# Patient Record
Sex: Male | Born: 1955
Health system: Southern US, Community
[De-identification: ages and names within clinical notes are randomized; demographics above are authoritative.]

## PROBLEM LIST (undated history)

## (undated) DIAGNOSIS — D649 Anemia, unspecified: Secondary | ICD-10-CM

## (undated) DIAGNOSIS — F1010 Alcohol abuse, uncomplicated: Secondary | ICD-10-CM

## (undated) DIAGNOSIS — K922 Gastrointestinal hemorrhage, unspecified: Secondary | ICD-10-CM

## (undated) DIAGNOSIS — I10 Essential (primary) hypertension: Secondary | ICD-10-CM

## (undated) DIAGNOSIS — I82409 Acute embolism and thrombosis of unspecified deep veins of unspecified lower extremity: Secondary | ICD-10-CM

## (undated) HISTORY — PX: BACK SURGERY: SHX140

---

## 1999-03-25 ENCOUNTER — Encounter: Payer: Self-pay | Admitting: Emergency Medicine

## 1999-03-25 ENCOUNTER — Emergency Department (HOSPITAL_COMMUNITY): Admission: EM | Admit: 1999-03-25 | Discharge: 1999-03-25 | Payer: Self-pay | Admitting: Emergency Medicine

## 1999-10-29 ENCOUNTER — Emergency Department (HOSPITAL_COMMUNITY): Admission: EM | Admit: 1999-10-29 | Discharge: 1999-10-29 | Payer: Self-pay | Admitting: Emergency Medicine

## 1999-10-29 ENCOUNTER — Encounter: Payer: Self-pay | Admitting: Emergency Medicine

## 2002-04-06 ENCOUNTER — Emergency Department (HOSPITAL_COMMUNITY): Admission: EM | Admit: 2002-04-06 | Discharge: 2002-04-06 | Payer: Self-pay | Admitting: Emergency Medicine

## 2003-06-02 ENCOUNTER — Emergency Department (HOSPITAL_COMMUNITY): Admission: AD | Admit: 2003-06-02 | Discharge: 2003-06-02 | Payer: Self-pay | Admitting: *Deleted

## 2003-06-07 ENCOUNTER — Encounter: Payer: Self-pay | Admitting: Emergency Medicine

## 2003-06-07 ENCOUNTER — Emergency Department (HOSPITAL_COMMUNITY): Admission: EM | Admit: 2003-06-07 | Discharge: 2003-06-07 | Payer: Self-pay | Admitting: Emergency Medicine

## 2003-06-20 ENCOUNTER — Encounter: Admission: RE | Admit: 2003-06-20 | Discharge: 2003-06-20 | Payer: Self-pay | Admitting: Internal Medicine

## 2003-06-27 ENCOUNTER — Encounter: Admission: RE | Admit: 2003-06-27 | Discharge: 2003-06-27 | Payer: Self-pay | Admitting: Internal Medicine

## 2003-07-18 ENCOUNTER — Encounter: Admission: RE | Admit: 2003-07-18 | Discharge: 2003-07-18 | Payer: Self-pay | Admitting: Internal Medicine

## 2003-08-28 ENCOUNTER — Encounter: Admission: RE | Admit: 2003-08-28 | Discharge: 2003-08-28 | Payer: Self-pay | Admitting: Internal Medicine

## 2003-12-19 ENCOUNTER — Encounter: Admission: RE | Admit: 2003-12-19 | Discharge: 2003-12-19 | Payer: Self-pay | Admitting: Internal Medicine

## 2004-12-09 ENCOUNTER — Emergency Department (HOSPITAL_COMMUNITY): Admission: EM | Admit: 2004-12-09 | Discharge: 2004-12-09 | Payer: Self-pay | Admitting: Emergency Medicine

## 2004-12-23 ENCOUNTER — Ambulatory Visit: Payer: Self-pay | Admitting: Internal Medicine

## 2005-01-13 ENCOUNTER — Ambulatory Visit: Payer: Self-pay | Admitting: Internal Medicine

## 2005-04-21 ENCOUNTER — Ambulatory Visit: Payer: Self-pay | Admitting: Hospitalist

## 2005-05-29 ENCOUNTER — Ambulatory Visit: Payer: Self-pay | Admitting: Internal Medicine

## 2005-06-03 ENCOUNTER — Ambulatory Visit: Payer: Self-pay | Admitting: Internal Medicine

## 2005-07-17 ENCOUNTER — Ambulatory Visit: Payer: Self-pay | Admitting: Hospitalist

## 2005-07-23 ENCOUNTER — Ambulatory Visit: Payer: Self-pay | Admitting: Internal Medicine

## 2005-07-25 ENCOUNTER — Ambulatory Visit (HOSPITAL_COMMUNITY): Admission: RE | Admit: 2005-07-25 | Discharge: 2005-07-25 | Payer: Self-pay | Admitting: Hospitalist

## 2005-08-19 ENCOUNTER — Encounter: Payer: Self-pay | Admitting: Cardiology

## 2005-08-19 ENCOUNTER — Ambulatory Visit (HOSPITAL_COMMUNITY): Admission: RE | Admit: 2005-08-19 | Discharge: 2005-08-19 | Payer: Self-pay | Admitting: Internal Medicine

## 2005-08-19 ENCOUNTER — Ambulatory Visit: Payer: Self-pay | Admitting: Cardiology

## 2005-09-15 ENCOUNTER — Ambulatory Visit: Payer: Self-pay | Admitting: Internal Medicine

## 2005-12-21 ENCOUNTER — Ambulatory Visit: Payer: Self-pay | Admitting: Internal Medicine

## 2006-02-24 ENCOUNTER — Ambulatory Visit: Payer: Self-pay | Admitting: Hospitalist

## 2006-03-18 ENCOUNTER — Ambulatory Visit: Payer: Self-pay | Admitting: Hospitalist

## 2006-03-25 ENCOUNTER — Ambulatory Visit (HOSPITAL_BASED_OUTPATIENT_CLINIC_OR_DEPARTMENT_OTHER): Admission: RE | Admit: 2006-03-25 | Discharge: 2006-03-25 | Payer: Self-pay | Admitting: Hospitalist

## 2006-03-28 ENCOUNTER — Ambulatory Visit: Payer: Self-pay | Admitting: Internal Medicine

## 2006-07-31 DIAGNOSIS — Z8669 Personal history of other diseases of the nervous system and sense organs: Secondary | ICD-10-CM

## 2006-07-31 DIAGNOSIS — E785 Hyperlipidemia, unspecified: Secondary | ICD-10-CM

## 2006-07-31 DIAGNOSIS — F17200 Nicotine dependence, unspecified, uncomplicated: Secondary | ICD-10-CM

## 2006-07-31 DIAGNOSIS — K219 Gastro-esophageal reflux disease without esophagitis: Secondary | ICD-10-CM | POA: Insufficient documentation

## 2006-07-31 DIAGNOSIS — I10 Essential (primary) hypertension: Secondary | ICD-10-CM | POA: Insufficient documentation

## 2006-07-31 DIAGNOSIS — N259 Disorder resulting from impaired renal tubular function, unspecified: Secondary | ICD-10-CM | POA: Insufficient documentation

## 2006-07-31 DIAGNOSIS — J342 Deviated nasal septum: Secondary | ICD-10-CM

## 2006-11-01 DIAGNOSIS — R51 Headache: Secondary | ICD-10-CM | POA: Insufficient documentation

## 2006-11-01 DIAGNOSIS — R519 Headache, unspecified: Secondary | ICD-10-CM | POA: Insufficient documentation

## 2006-11-14 DIAGNOSIS — M549 Dorsalgia, unspecified: Secondary | ICD-10-CM | POA: Insufficient documentation

## 2006-11-28 ENCOUNTER — Emergency Department (HOSPITAL_COMMUNITY): Admission: EM | Admit: 2006-11-28 | Discharge: 2006-11-28 | Payer: Self-pay | Admitting: Emergency Medicine

## 2006-12-06 DIAGNOSIS — Z8719 Personal history of other diseases of the digestive system: Secondary | ICD-10-CM

## 2006-12-07 ENCOUNTER — Encounter: Payer: Self-pay | Admitting: *Deleted

## 2006-12-08 ENCOUNTER — Ambulatory Visit: Payer: Self-pay | Admitting: Hospitalist

## 2006-12-08 ENCOUNTER — Encounter (INDEPENDENT_AMBULATORY_CARE_PROVIDER_SITE_OTHER): Payer: Self-pay | Admitting: Unknown Physician Specialty

## 2006-12-08 LAB — CONVERTED CEMR LAB
Chloride: 106 meq/L (ref 96–112)
Glucose, Bld: 79 mg/dL (ref 70–99)
Potassium: 4.7 meq/L (ref 3.5–5.3)
Sodium: 139 meq/L (ref 135–145)

## 2006-12-11 ENCOUNTER — Encounter (INDEPENDENT_AMBULATORY_CARE_PROVIDER_SITE_OTHER): Payer: Self-pay | Admitting: Unknown Physician Specialty

## 2006-12-11 ENCOUNTER — Ambulatory Visit (HOSPITAL_COMMUNITY): Admission: RE | Admit: 2006-12-11 | Discharge: 2006-12-11 | Payer: Self-pay | Admitting: Unknown Physician Specialty

## 2006-12-12 LAB — CONVERTED CEMR LAB: OCCULT 1: NEGATIVE

## 2006-12-13 LAB — CONVERTED CEMR LAB: OCCULT 3: NEGATIVE

## 2006-12-17 ENCOUNTER — Ambulatory Visit: Payer: Self-pay | Admitting: Internal Medicine

## 2006-12-29 ENCOUNTER — Ambulatory Visit: Payer: Self-pay | Admitting: Internal Medicine

## 2006-12-30 ENCOUNTER — Encounter (INDEPENDENT_AMBULATORY_CARE_PROVIDER_SITE_OTHER): Payer: Self-pay | Admitting: Internal Medicine

## 2006-12-30 DIAGNOSIS — IMO0002 Reserved for concepts with insufficient information to code with codable children: Secondary | ICD-10-CM | POA: Insufficient documentation

## 2006-12-30 LAB — CONVERTED CEMR LAB
Chloride: 107 meq/L (ref 96–112)
Potassium: 4.2 meq/L (ref 3.5–5.3)
Sodium: 142 meq/L (ref 135–145)

## 2007-03-31 ENCOUNTER — Telehealth (INDEPENDENT_AMBULATORY_CARE_PROVIDER_SITE_OTHER): Payer: Self-pay | Admitting: Unknown Physician Specialty

## 2007-04-08 ENCOUNTER — Encounter (INDEPENDENT_AMBULATORY_CARE_PROVIDER_SITE_OTHER): Payer: Self-pay | Admitting: Hospitalist

## 2007-04-08 ENCOUNTER — Telehealth: Payer: Self-pay | Admitting: *Deleted

## 2007-04-25 ENCOUNTER — Ambulatory Visit: Payer: Self-pay | Admitting: Hospitalist

## 2007-05-03 ENCOUNTER — Ambulatory Visit: Payer: Self-pay | Admitting: Internal Medicine

## 2007-05-03 ENCOUNTER — Encounter (INDEPENDENT_AMBULATORY_CARE_PROVIDER_SITE_OTHER): Payer: Self-pay | Admitting: Hospitalist

## 2007-05-03 LAB — CONVERTED CEMR LAB
Albumin: 4.4 g/dL (ref 3.5–5.2)
BUN: 12 mg/dL (ref 6–23)
CO2: 22 meq/L (ref 19–32)
Calcium: 9.3 mg/dL (ref 8.4–10.5)
Cholesterol: 190 mg/dL (ref 0–200)
Glucose, Bld: 87 mg/dL (ref 70–99)
HDL: 31 mg/dL — ABNORMAL LOW (ref >39)
Potassium: 4.4 meq/L (ref 3.5–5.3)
Sodium: 144 meq/L (ref 135–145)
Total Protein: 6.6 g/dL (ref 6.0–8.3)
Triglycerides: 156 mg/dL — ABNORMAL HIGH (ref <150)

## 2007-08-09 ENCOUNTER — Ambulatory Visit: Payer: Self-pay | Admitting: Internal Medicine

## 2007-08-09 DIAGNOSIS — F411 Generalized anxiety disorder: Secondary | ICD-10-CM | POA: Insufficient documentation

## 2009-04-02 ENCOUNTER — Emergency Department (HOSPITAL_BASED_OUTPATIENT_CLINIC_OR_DEPARTMENT_OTHER): Admission: EM | Admit: 2009-04-02 | Discharge: 2009-04-02 | Payer: Self-pay | Admitting: Emergency Medicine

## 2009-10-31 ENCOUNTER — Encounter (INDEPENDENT_AMBULATORY_CARE_PROVIDER_SITE_OTHER): Payer: Self-pay | Admitting: *Deleted

## 2009-11-17 ENCOUNTER — Emergency Department (HOSPITAL_COMMUNITY): Admission: EM | Admit: 2009-11-17 | Discharge: 2009-11-17 | Payer: Self-pay | Admitting: Emergency Medicine

## 2010-06-28 ENCOUNTER — Emergency Department (HOSPITAL_BASED_OUTPATIENT_CLINIC_OR_DEPARTMENT_OTHER): Admission: EM | Admit: 2010-06-28 | Discharge: 2010-06-28 | Payer: Self-pay | Admitting: Emergency Medicine

## 2010-07-09 ENCOUNTER — Emergency Department (HOSPITAL_BASED_OUTPATIENT_CLINIC_OR_DEPARTMENT_OTHER): Admission: EM | Admit: 2010-07-09 | Discharge: 2010-07-09 | Payer: Self-pay | Admitting: Emergency Medicine

## 2010-07-09 ENCOUNTER — Ambulatory Visit: Payer: Self-pay | Admitting: Diagnostic Radiology

## 2010-08-17 ENCOUNTER — Emergency Department (HOSPITAL_BASED_OUTPATIENT_CLINIC_OR_DEPARTMENT_OTHER): Admission: EM | Admit: 2010-08-17 | Discharge: 2010-08-17 | Payer: Self-pay | Admitting: Emergency Medicine

## 2010-08-27 ENCOUNTER — Encounter
Admission: RE | Admit: 2010-08-27 | Discharge: 2010-11-06 | Payer: Self-pay | Source: Home / Self Care | Attending: Orthopedic Surgery | Admitting: Orthopedic Surgery

## 2010-11-11 NOTE — Letter (Signed)
Summary: Generic Letter  [facility]  [address] [place], [place] [address]   Phone: [phone]  Fax: [phone]    10/31/09  CHRISTIPHER RIEGER [address] [place], [place]  [address]  Dear Mr. MENO,   We have been unable to reach you by phone. Just wanted you to be aware we now have a "no show policy." That means if you miss 4 or more appointments you risk the opportunity to receive services here. Please make sure you call and cancel appointments if you are not able to come. Cancelled appointments will not be held against you.        Sincerely,   Gaylyn Cheers RN

## 2011-01-02 LAB — URINALYSIS, ROUTINE W REFLEX MICROSCOPIC
Bilirubin Urine: NEGATIVE
Glucose, UA: NEGATIVE mg/dL
Ketones, ur: 15 mg/dL — AB
Nitrite: NEGATIVE
pH: 6.5 (ref 5.0–8.0)

## 2011-01-02 LAB — CBC
Hemoglobin: 15.1 g/dL (ref 13.0–17.0)
RBC: 4.83 MIL/uL (ref 4.22–5.81)
RDW: 14.4 % (ref 11.5–15.5)
WBC: 7.1 10*3/uL (ref 4.0–10.5)

## 2011-01-02 LAB — POCT CARDIAC MARKERS
Troponin i, poc: <0.05 ng/mL (ref 0.00–0.09)
Troponin i, poc: <0.05 ng/mL (ref 0.00–0.09)

## 2011-01-02 LAB — DIFFERENTIAL
Basophils Absolute: 0.1 10*3/uL (ref 0.0–0.1)
Lymphocytes Relative: 36 % (ref 12–46)
Lymphs Abs: 2.5 10*3/uL (ref 0.7–4.0)
Monocytes Absolute: 0.8 10*3/uL (ref 0.1–1.0)
Monocytes Relative: 11 % (ref 3–12)
Neutro Abs: 3.7 10*3/uL (ref 1.7–7.7)

## 2011-01-02 LAB — POCT I-STAT, CHEM 8
BUN: 23 mg/dL (ref 6–23)
Calcium, Ion: 0.86 mmol/L — ABNORMAL LOW (ref 1.12–1.32)
Chloride: 110 mEq/L (ref 96–112)
Creatinine, Ser: 1.7 mg/dL — ABNORMAL HIGH (ref 0.4–1.5)

## 2011-01-02 LAB — GLUCOSE, CAPILLARY

## 2011-01-02 LAB — ETHANOL: Alcohol, Ethyl (B): 31 mg/dL — ABNORMAL HIGH (ref 0–10)

## 2011-02-27 NOTE — Procedures (Signed)
NAME:  Timothy Chambers, Timothy Chambers NO.:  [id number]   MEDICAL RECORD NO.:  [id number]          PATIENT TYPE:  OUT   LOCATION:  SLEEP CENTER                 FACILITY:  [facility]   PHYSICIAN:  Clinton D. Maple Hudson, M.D. DATE OF BIRTH:  [birth year]   DATE OF STUDY:  03/25/2006                              NOCTURNAL POLYSOMNOGRAM   REFERRING PHYSICIAN:  Dr. Eliseo Gum.   INDICATIONS FOR STUDY:  Hypersomnia with sleep apnea.   EPWORTH SLEEPINESS SCORE:  05/24.   BMI:  23.   WEIGHT:  142 pounds.   HOME MEDICATIONS:  Toprol, HCTZ.   SLEEP ARCHITECTURE:  Of total sleep time 345 minutes with sleep efficiency  75%.  Stage I was 4%, stage II 78%, stages III and IV absent, REM 18% of  total sleep time.  Sleep latency 11 minutes, REM latency 88 minutes, awake  after sleep onset 106 minutes, arousal index increased at 33 indicating  increased fragmentation.  No bedtime medication was taken.   RESPIRATORY DATA:  Split study protocol.  Apnea/hypopnea index (AHI, RDI)  12.8 obstructive events per hour indicating mild obstructive sleep  apnea/hypopnea syndrome before CPAP.  This included 13 obstructive apneas  and 17 hypopneas before CPAP.  Events were positional, more frequent but not  limited to the supine position.  REM AHI 8.8 per hour.  CPAP was titrated to  10 CWP, AHI 2.2 per hour.  A petite Respironics ComfortGel nasal mask was  used with heated humidity.   OXYGEN DATA:  Very loud snoring was noted with oxygen desaturation to a  nadir of 90% before CPAP.  After CPAP control snoring was prevented and  oxygen saturation held 96-97% on room air.   CARDIAC DATA:  Normal sinus rhythm.   MOVEMENT/PARASOMNIA:  A total of 76 limb jerks were recorded of which 62  were associated with arousal or awakening for a significant periodic limb  movement with arousal index of 10.8 per hour.  Bathroom times one.   IMPRESSION/RECOMMENDATIONS:  1.  Mild obstructive sleep apnea/hypopnea syndrome,  AHI 12.8 per hour with      positional events more common while supine, very loud snoring and oxygen      desaturation to a nadir of 90%.  2.  Successful CPAP titration to 10 CWP, AHI 2.2 per hour.  A petite      Respironics ComfortGel nasal mask was used with heated humidifier.  3.  Periodic limb movement with arousal, 10.8 per hour.  This may partly      reflect sleep disturbance of CPAP titration but if persistent after      adjustment to home CPAP then he may benefit from a trial of specific      therapy such as Requip or MiraPex if appropriate.      Clinton D. Maple Hudson, M.D.  Diplomate, Biomedical engineer of Sleep Medicine  Electronically Signed     CDY/MEDQ  D:  03/28/2006 10:19:48  T:  03/29/2006 11:03:31  Job:  [id number]

## 2012-02-24 ENCOUNTER — Emergency Department (HOSPITAL_BASED_OUTPATIENT_CLINIC_OR_DEPARTMENT_OTHER)
Admission: EM | Admit: 2012-02-24 | Discharge: 2012-02-24 | Disposition: A | Discharge status: Home / Self Care | Payer: Managed Care, Other (non HMO) | Attending: Emergency Medicine | Admitting: Emergency Medicine

## 2012-02-24 ENCOUNTER — Encounter (HOSPITAL_BASED_OUTPATIENT_CLINIC_OR_DEPARTMENT_OTHER): Payer: Self-pay | Admitting: *Deleted

## 2012-02-24 DIAGNOSIS — R21 Rash and other nonspecific skin eruption: Secondary | ICD-10-CM

## 2012-02-24 DIAGNOSIS — I10 Essential (primary) hypertension: Secondary | ICD-10-CM | POA: Insufficient documentation

## 2012-02-24 HISTORY — DX: Essential (primary) hypertension: I10

## 2012-02-24 MED ORDER — PREDNISONE (PAK) 10 MG PO TABS: 10.0000 mg | ORAL_TABLET | Freq: Every day | ORAL | Status: AC

## 2012-02-24 NOTE — ED Notes (Signed)
PA at bedside.

## 2012-02-24 NOTE — Discharge Instructions (Signed)
Rash A rash is a change in the color or texture of your skin. There are many different types of rashes. You may have other problems that accompany your rash. CAUSES   Infections.   Allergic reactions. This can include allergies to pets or foods.   Certain medicines.   Exposure to certain chemicals, soaps, or cosmetics.   Heat.   Exposure to poisonous plants.   Tumors, both cancerous and noncancerous.  SYMPTOMS   Redness.   Scaly skin.   Itchy skin.   Dry or cracked skin.   Bumps.   Blisters.   Pain.  DIAGNOSIS  Your caregiver may do a physical exam to determine what type of rash you have. A skin sample (biopsy) may be taken and examined under a microscope. TREATMENT  Treatment depends on the type of rash you have. Your caregiver may prescribe certain medicines. For serious conditions, you may need to see a skin doctor (dermatologist). HOME CARE INSTRUCTIONS   Avoid the substance that caused your rash.   Do not scratch your rash. This can cause infection.   You may take cool baths to help stop itching.   Only take over-the-counter or prescription medicines as directed by your caregiver.   Keep all follow-up appointments as directed by your caregiver.  SEEK IMMEDIATE MEDICAL CARE IF:  You have increasing pain, swelling, or redness.   You have a fever.   You have new or severe symptoms.   You have body aches, diarrhea, or vomiting.   Your rash is not better after 3 days.  MAKE SURE YOU:  Understand these instructions.   Will watch your condition.   Will get help right away if you are not doing well or get worse.  Document Released: 09/18/2002 Document Revised: 09/17/2011 Document Reviewed: 07/13/2011 ExitCare Patient Information 2012 [employer]. 

## 2012-02-24 NOTE — ED Notes (Signed)
Pt c/o rash to neck x 2 weeks

## 2012-02-24 NOTE — ED Provider Notes (Signed)
History     CSN: [id number]  Arrival date & time 02/24/12  1749   First MD Initiated Contact with Patient 02/24/12 1756      Chief Complaint  Patient presents with  . Rash    (Consider location/radiation/quality/duration/timing/severity/associated sxs/prior treatment) HPI Comments: Pt states that 2 weeks ago after being seen at the barber shop he developed a rash on the back of his neck:pt states that then while mowing the lawn today he broke out on a rash on his arms bilaterally  Patient is a 57 y.o. male presenting with rash. The history is provided by the patient. No language interpreter was used.  Rash  This is a new problem. The current episode started more than 1 week ago. The problem has not changed since onset.The problem is associated with an unknown factor. There has been no fever. The rash is present on the neck (bilateral arms). The patient is experiencing no pain. The pain has been constant since onset. Associated symptoms include itching. Pertinent negatives include no pain and no weeping. Treatments tried: antifungal otc. The treatment provided no relief.    Past Medical History  Diagnosis Date  . Hypertension     History reviewed. No pertinent past surgical history.  History reviewed. No pertinent family history.  History  Substance Use Topics  . Smoking status: Current Everyday Smoker -- 0.5 packs/day  . Smokeless tobacco: Not on file  . Alcohol Use: No      Review of Systems  Constitutional: Negative.   Respiratory: Negative.   Cardiovascular: Negative.   Skin: Positive for itching and rash.    Allergies  Hydrocodone-acetaminophen  Home Medications  No current outpatient prescriptions on file.  BP 161/97  Pulse 89  Temp 98.5 F (36.9 C)  Resp 16  Ht 5\' 5"  (1.651 m)  Wt 160 lb (72.576 kg)  BMI 26.63 kg/m2  SpO2 99%  Physical Exam  Nursing note and vitals reviewed. Constitutional: He appears well-developed and well-nourished.  Neck:  Normal range of motion. Neck supple.  Cardiovascular: Normal rate and regular rhythm.   Pulmonary/Chest: Effort normal and breath sounds normal.  Musculoskeletal: Normal range of motion.  Neurological: He is alert.  Skin:       Pt has red scaly patches to back of the neck:pt has red raised papules to bilateral arms and hands    ED Course  Procedures (including critical care time)  Labs Reviewed - No data to display No results found.   1. Rash       MDM  Both are allergic in a nature will treat with steroids         Teressa Lower, NP 02/24/12 1819

## 2012-02-24 NOTE — ED Provider Notes (Signed)
Medical screening examination/treatment/procedure(s) were performed by non-physician practitioner and as supervising physician I was immediately available for consultation/collaboration.   Lirio Bach B. Bernette Mayers, MD 02/24/12 4098

## 2012-05-17 ENCOUNTER — Emergency Department (HOSPITAL_BASED_OUTPATIENT_CLINIC_OR_DEPARTMENT_OTHER)
Admission: EM | Admit: 2012-05-17 | Discharge: 2012-05-17 | Discharge status: Against Medical Advice | Payer: Managed Care, Other (non HMO)

## 2013-03-10 ENCOUNTER — Emergency Department (HOSPITAL_BASED_OUTPATIENT_CLINIC_OR_DEPARTMENT_OTHER): Payer: Managed Care, Other (non HMO)

## 2013-03-10 ENCOUNTER — Emergency Department (HOSPITAL_BASED_OUTPATIENT_CLINIC_OR_DEPARTMENT_OTHER)
Admission: EM | Admit: 2013-03-10 | Discharge: 2013-03-10 | Disposition: A | Discharge status: Home / Self Care | Payer: Managed Care, Other (non HMO) | Attending: Emergency Medicine | Admitting: Emergency Medicine

## 2013-03-10 ENCOUNTER — Encounter (HOSPITAL_BASED_OUTPATIENT_CLINIC_OR_DEPARTMENT_OTHER): Payer: Self-pay | Admitting: *Deleted

## 2013-03-10 DIAGNOSIS — S79919A Unspecified injury of unspecified hip, initial encounter: Secondary | ICD-10-CM | POA: Insufficient documentation

## 2013-03-10 DIAGNOSIS — IMO0002 Reserved for concepts with insufficient information to code with codable children: Secondary | ICD-10-CM | POA: Insufficient documentation

## 2013-03-10 DIAGNOSIS — Y92410 Unspecified street and highway as the place of occurrence of the external cause: Secondary | ICD-10-CM | POA: Insufficient documentation

## 2013-03-10 DIAGNOSIS — I10 Essential (primary) hypertension: Secondary | ICD-10-CM | POA: Insufficient documentation

## 2013-03-10 DIAGNOSIS — S79929A Unspecified injury of unspecified thigh, initial encounter: Secondary | ICD-10-CM | POA: Insufficient documentation

## 2013-03-10 DIAGNOSIS — Z79899 Other long term (current) drug therapy: Secondary | ICD-10-CM | POA: Insufficient documentation

## 2013-03-10 DIAGNOSIS — T07XXXA Unspecified multiple injuries, initial encounter: Secondary | ICD-10-CM | POA: Insufficient documentation

## 2013-03-10 DIAGNOSIS — Y9389 Activity, other specified: Secondary | ICD-10-CM | POA: Insufficient documentation

## 2013-03-10 DIAGNOSIS — F17200 Nicotine dependence, unspecified, uncomplicated: Secondary | ICD-10-CM | POA: Insufficient documentation

## 2013-03-10 DIAGNOSIS — S8990XA Unspecified injury of unspecified lower leg, initial encounter: Secondary | ICD-10-CM | POA: Insufficient documentation

## 2013-03-10 LAB — URINALYSIS, ROUTINE W REFLEX MICROSCOPIC
Bilirubin Urine: NEGATIVE
Hgb urine dipstick: NEGATIVE
Specific Gravity, Urine: 1.018 (ref 1.005–1.030)
pH: 6.5 (ref 5.0–8.0)

## 2013-03-10 MED ORDER — OXYCODONE HCL 5 MG PO TABS
5.0000 mg | ORAL_TABLET | Freq: Once | ORAL | Status: DC
  Filled 2013-03-10: qty 1

## 2013-03-10 MED ORDER — IBUPROFEN 800 MG PO TABS
800.0000 mg | ORAL_TABLET | Freq: Once | ORAL | Status: AC
  Administered 2013-03-10: 800 mg via ORAL
  Filled 2013-03-10: qty 1

## 2013-03-10 MED ORDER — OXYCODONE-ACETAMINOPHEN 5-325 MG PO TABS: 2.0000 | ORAL_TABLET | ORAL | Status: DC | PRN

## 2013-03-10 MED ORDER — MORPHINE SULFATE 4 MG/ML IJ SOLN
4.0000 mg | Freq: Once | INTRAMUSCULAR | Status: AC
  Administered 2013-03-10: 4 mg via INTRAMUSCULAR
  Filled 2013-03-10: qty 1

## 2013-03-10 MED ORDER — IBUPROFEN 800 MG PO TABS: 800.0000 mg | ORAL_TABLET | Freq: Three times a day (TID) | ORAL | Status: DC
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
## 2013-09-28 ENCOUNTER — Emergency Department (HOSPITAL_BASED_OUTPATIENT_CLINIC_OR_DEPARTMENT_OTHER)
Admission: EM | Admit: 2013-09-28 | Discharge: 2013-09-28 | Disposition: A | Discharge status: Home / Self Care | Payer: Managed Care, Other (non HMO) | Attending: Emergency Medicine | Admitting: Emergency Medicine

## 2013-09-28 ENCOUNTER — Encounter (HOSPITAL_BASED_OUTPATIENT_CLINIC_OR_DEPARTMENT_OTHER): Payer: Self-pay | Admitting: Emergency Medicine

## 2013-09-28 DIAGNOSIS — I10 Essential (primary) hypertension: Secondary | ICD-10-CM | POA: Insufficient documentation

## 2013-09-28 DIAGNOSIS — F1010 Alcohol abuse, uncomplicated: Secondary | ICD-10-CM

## 2013-09-28 DIAGNOSIS — Z79899 Other long term (current) drug therapy: Secondary | ICD-10-CM | POA: Insufficient documentation

## 2013-09-28 DIAGNOSIS — F17200 Nicotine dependence, unspecified, uncomplicated: Secondary | ICD-10-CM | POA: Insufficient documentation

## 2013-09-28 HISTORY — DX: Alcohol abuse, uncomplicated: F10.10

## 2013-09-28 LAB — CBC WITH DIFFERENTIAL/PLATELET
Basophils Absolute: 0 10*3/uL (ref 0.0–0.1)
Basophils Relative: 0 % (ref 0–1)
Eosinophils Relative: 3 % (ref 0–5)
HCT: 39.8 % (ref 39.0–52.0)
Lymphocytes Relative: 32 % (ref 12–46)
MCHC: 34.4 g/dL (ref 30.0–36.0)
Monocytes Absolute: 1 10*3/uL (ref 0.1–1.0)
Neutro Abs: 4.1 10*3/uL (ref 1.7–7.7)
Platelets: 245 10*3/uL (ref 150–400)
RBC: 4.39 MIL/uL (ref 4.22–5.81)
RDW: 13.6 % (ref 11.5–15.5)

## 2013-09-28 LAB — COMPREHENSIVE METABOLIC PANEL
AST: 68 U/L — ABNORMAL HIGH (ref 0–37)
Albumin: 4 g/dL (ref 3.5–5.2)
CO2: 24 mEq/L (ref 19–32)
Calcium: 9.4 mg/dL (ref 8.4–10.5)
Creatinine, Ser: 1.5 mg/dL — ABNORMAL HIGH (ref 0.50–1.35)
GFR calc Af Amer: 59 mL/min — ABNORMAL LOW (ref >90)
Potassium: 3.7 mEq/L (ref 3.5–5.1)
Sodium: 138 mEq/L (ref 135–145)
Total Protein: 7.4 g/dL (ref 6.0–8.3)

## 2013-09-28 LAB — RAPID URINE DRUG SCREEN, HOSP PERFORMED
Amphetamines: NOT DETECTED
Cocaine: NOT DETECTED
Opiates: NOT DETECTED
Tetrahydrocannabinol: NOT DETECTED

## 2013-09-28 NOTE — ED Notes (Signed)
Pt amb to triage with quick steady gait in nad. Pt states that he drank an "internal cleanser and detox" and needs [place] to write him a note so that he does not have to go to detox and can join [facility]. Pt denies any c/o, states "I feel great, I just need a note.Marland KitchenMarland Kitchen"

## 2013-09-28 NOTE — ED Provider Notes (Signed)
CSN: [id number]     Arrival date & time 09/28/13  1659 History   First MD Initiated Contact with Patient 09/28/13 1726     Chief Complaint  Patient presents with  . Medical Clearance   (Consider location/radiation/quality/duration/timing/severity/associated sxs/prior Treatment) HPI Comments: Patient presents requesting medical clearance. He admits to relapsing with drinking alcohol last week. Use an over-the-counter "detox" solution from [facility] and has been taking a lot of water. He states he wants a note so he can go to an alcoholic anonymous type meeting. He denies any suicidal ideation or homicidal ideation. His last drink was 8 days ago. He denies any shakes, numbness or tingling. Denies any pain. He denies any intentional ingestions.  The history is provided by the patient.    Past Medical History  Diagnosis Date  . Hypertension   . Alcohol abuse    Past Surgical History  Procedure Laterality Date  . Back surgery     History reviewed. No pertinent family history. History  Substance Use Topics  . Smoking status: Current Every Day Smoker -- 0.50 packs/day  . Smokeless tobacco: Not on file  . Alcohol Use: Yes     Comment: occ    Review of Systems  Constitutional: Negative for fever, activity change and appetite change.  Eyes: Negative for visual disturbance.  Respiratory: Negative for cough, chest tightness and shortness of breath.   Cardiovascular: Negative for chest pain.  Gastrointestinal: Negative for nausea, vomiting and abdominal pain.  Genitourinary: Negative for dysuria and hematuria.  Musculoskeletal: Negative for arthralgias, back pain and myalgias.  Skin: Negative for wound.  Neurological: Negative for dizziness, weakness and headaches.  A complete 10 system review of systems was obtained and all systems are negative except as noted in the HPI and PMH.    Allergies  Hydrocodone-acetaminophen  Home Medications   Current Outpatient Rx  Name  Route  Sig   Dispense  Refill  . amLODipine-valsartan (EXFORGE) 5-160 MG per tablet   Oral   Take 1 tablet by mouth daily.         Marland Kitchen ibuprofen (ADVIL,MOTRIN) 800 MG tablet   Oral   Take 1 tablet (800 mg total) by mouth 3 (three) times daily.   21 tablet   0   . oxyCODONE-acetaminophen (PERCOCET/ROXICET) 5-325 MG per tablet   Oral   Take 2 tablets by mouth every 4 (four) hours as needed for pain.   15 tablet   0    BP 173/99  Pulse 98  Temp(Src) 98.5 F (36.9 C) (Oral)  Resp 18  SpO2 100% Physical Exam  Constitutional: He is oriented to person, place, and time. He appears well-developed and well-nourished. No distress.  HENT:  Head: Normocephalic and atraumatic.  Mouth/Throat: Oropharynx is clear and moist. No oropharyngeal exudate.  Eyes: Conjunctivae and EOM are normal. Pupils are equal, round, and reactive to light.  Neck: Normal range of motion. Neck supple.  Cardiovascular: Normal rate, regular rhythm and normal heart sounds.   No murmur heard. Pulmonary/Chest: Effort normal and breath sounds normal.  Abdominal: Soft. There is no tenderness. There is no rebound and no guarding.  Musculoskeletal: Normal range of motion. He exhibits no edema and no tenderness.  Neurological: He is alert and oriented to person, place, and time. No cranial nerve deficit. He exhibits normal muscle tone. Coordination normal.  No tremors, no asterixis CN 2-12 intact, no ataxia on finger to nose, no nystagmus, 5/5 strength throughout, no pronator drift, Romberg negative, normal gait.  Skin: Skin is warm.    ED Course  Procedures (including critical care time) Labs Review Labs Reviewed  COMPREHENSIVE METABOLIC PANEL - Abnormal; Notable for the following:    Creatinine, Ser 1.50 (*)    AST 68 (*)    ALT 93 (*)    GFR calc non Af Amer 51 (*)    GFR calc Af Amer 59 (*)    All other components within normal limits  CBC WITH DIFFERENTIAL  ETHANOL  URINE RAPID DRUG SCREEN (HOSP PERFORMED)    Imaging Review No results found.  EKG Interpretation   None       MDM   1. Alcohol abuse    History of alcohol abuse requesting medical clearance before joining a support group. Denies SI or HI.  Vital stable, no distress. No evidence of active alcohol withdrawal.  Labs reviewed. Mild elevation of AST and ALT likely secondary to alcohol abuse. No right upper quadrant pain or vomiting. Creatinine is at baseline. Patient is medically clear for participation in his rehabilitation program.  BP 173/99  Pulse 98  Temp(Src) 98.5 F (36.9 C) (Oral)  Resp 18  SpO2 100%   Glynn Octave, MD 09/28/13 2132

## 2017-12-23 ENCOUNTER — Ambulatory Visit (INDEPENDENT_AMBULATORY_CARE_PROVIDER_SITE_OTHER): Payer: Self-pay | Admitting: Family Medicine

## 2017-12-23 ENCOUNTER — Encounter: Payer: Self-pay | Admitting: Family Medicine

## 2017-12-23 VITALS — BP 170/120 | HR 97 | Temp 98.9°F | Ht 65.0 in | Wt 144.4 lb

## 2017-12-23 DIAGNOSIS — I10 Essential (primary) hypertension: Secondary | ICD-10-CM

## 2017-12-23 MED ORDER — AMLODIPINE BESYLATE 5 MG PO TABS: 5.0000 mg | ORAL_TABLET | Freq: Every day | ORAL | 3 refills | Status: DC

## 2017-12-23 MED ORDER — HYDROCHLOROTHIAZIDE 25 MG PO TABS: 25.0000 mg | ORAL_TABLET | Freq: Every day | ORAL | 3 refills | Status: DC

## 2017-12-23 NOTE — Progress Notes (Signed)
Pre visit review using our clinic review tool, if applicable. No additional management support is needed unless otherwise documented below in the visit note. 

## 2017-12-23 NOTE — Progress Notes (Signed)
Chief Complaint  Patient presents with  . Establish Care       New Patient Visit SUBJECTIVE: HPI: Timothy Chambers is an 62 y.o.male who is being seen for establishing care.  Hypertension Patient presents for hypertension discussion. He does not monitor home blood pressures. He is compliant with medications. Patient has these side effects of medication: none He is adhering to a healthy diet overall. Exercise: physically active at home, walks Has been on Exforge in past, but got expensive. +famhx of heart disease and HTN. Denies CP or SOB.   Allergies  Allergen Reactions  . Hydrocodone-Acetaminophen     itch    Past Medical History:  Diagnosis Date  . Alcohol abuse   . Hypertension    Past Surgical History:  Procedure Laterality Date  . BACK SURGERY     Social History   Socioeconomic History  . Marital status: Divorced  Tobacco Use  . Smoking status: Current Every Day Smoker    Packs/day: 0.50  . Smokeless tobacco: Never Used  Substance and Sexual Activity  . Alcohol use: Yes    Comment: occ  . Drug use: No  . Sexual activity: No   Family History  Problem Relation Age of Onset  . Cancer Mother   . Heart disease Mother   . Hypertension Father   . Heart disease Father   . Hypertension Sister      ROS Cardiovascular: Denies chest pain  Respiratory: Denies dyspnea   OBJECTIVE: BP (!) 170/120 (BP Location: Left Arm, Patient Position: Sitting, Cuff Size: Large)   Pulse 97   Temp 98.9 F (37.2 C) (Oral)   Ht 5\' 5"  (1.651 m)   Wt 144 lb 6 oz (65.5 kg)   SpO2 98%   BMI 24.03 kg/m   Constitutional: -  VS reviewed -  Well developed, well nourished, appears stated age -  No apparent distress  Psychiatric: -  Oriented to person, place, and time -  Memory intact -  Affect and mood normal -  Fluent conversation, good eye contact -  Judgment and insight age appropriate  Eye: -  Conjunctivae clear, no discharge -  Pupils symmetric, round, reactive to  light  ENMT: -  MMM    Pharynx moist, no exudate, no erythema  Cardiovascular: -  RRR -  No LE edema  Respiratory: -  Normal respiratory effort, no accessory muscle use, no retraction -  Breath sounds equal, no wheezes, no ronchi, no crackles  Musculoskeletal: -  No clubbing, no cyanosis -  Gait normal  Skin: -  No significant lesion on inspection -  Warm and dry to palpation   ASSESSMENT/PLAN: Essential hypertension - Plan: amLODipine (NORVASC) 5 MG tablet, hydrochlorothiazide (HYDRODIURIL) 25 MG tablet, Basic metabolic panel  [facility], check lytes and renal function. Home bp monitoring discussed and written down. Counseled on diet and exercise. Patient should return 4 weeks. The patient voiced understanding and agreement to the plan.   Jilda Rocheicholas Paul BainbridgeWendling, DO 12/23/17  1:29 PM

## 2017-12-23 NOTE — Patient Instructions (Addendum)
Around 3 times per week, check your blood pressure 4 times per day. Twice in the morning and twice in the evening. The readings should be at least one minute apart. Write down these values and bring them to your next nurse visit/appointment.  When you check your BP, make sure you have been doing something calm/relaxing 5 minutes prior to checking. Both feet should be flat on the floor and you should be sitting. Use your left arm and make sure it is in a relaxed position (on a table), and that the cuff is at the approximate level/height of your heart.   DASH Eating Plan DASH stands for "Dietary Approaches to Stop Hypertension." The DASH eating plan is a healthy eating plan that has been shown to reduce high blood pressure (hypertension). It may also reduce your risk for type 2 diabetes, heart disease, and stroke. The DASH eating plan may also help with weight loss. What are tips for following this plan? General guidelines  Avoid eating more than 2,300 mg (milligrams) of salt (sodium) a day. If you have hypertension, you may need to reduce your sodium intake to 1,500 mg a day.  Limit alcohol intake to no more than 1 drink a day for nonpregnant women and 2 drinks a day for men. One drink equals 12 oz of beer, 5 oz of wine, or 1 oz of hard liquor.  Work with your health care provider to maintain a healthy body weight or to lose weight. Ask what an ideal weight is for you.  Get at least 30 minutes of exercise that causes your heart to beat faster (aerobic exercise) most days of the week. Activities may include walking, swimming, or biking.  Work with your health care provider or diet and nutrition specialist (dietitian) to adjust your eating plan to your individual calorie needs. Reading food labels  Check food labels for the amount of sodium per serving. Choose foods with less than 5 percent of the Daily Value of sodium. Generally, foods with less than 300 mg of sodium per serving fit into this  eating plan.  To find whole grains, look for the word "whole" as the first word in the ingredient list. Shopping  Buy products labeled as "low-sodium" or "no salt added."  Buy fresh foods. Avoid canned foods and premade or frozen meals. Cooking  Avoid adding salt when cooking. Use salt-free seasonings or herbs instead of table salt or sea salt. Check with your health care provider or pharmacist before using salt substitutes.  Do not fry foods. Cook foods using healthy methods such as baking, boiling, grilling, and broiling instead.  Cook with heart-healthy oils, such as olive, canola, soybean, or sunflower oil. Meal planning   Eat a balanced diet that includes: ? 5 or more servings of fruits and vegetables each day. At each meal, try to fill half of your plate with fruits and vegetables. ? Up to 6-8 servings of whole grains each day. ? Less than 6 oz of lean meat, poultry, or fish each day. A 3-oz serving of meat is about the same size as a deck of cards. One egg equals 1 oz. ? 2 servings of low-fat dairy each day. ? A serving of nuts, seeds, or beans 5 times each week. ? Heart-healthy fats. Healthy fats called Omega-3 fatty acids are found in foods such as flaxseeds and coldwater fish, like sardines, salmon, and mackerel.  Limit how much you eat of the following: ? Canned or prepackaged foods. ? Food that   is high in trans fat, such as fried foods. ? Food that is high in saturated fat, such as fatty meat. ? Sweets, desserts, sugary drinks, and other foods with added sugar. ? Full-fat dairy products.  Do not salt foods before eating.  Try to eat at least 2 vegetarian meals each week.  Eat more home-cooked food and less restaurant, buffet, and fast food.  When eating at a restaurant, ask that your food be prepared with less salt or no salt, if possible. What foods are recommended? The items listed may not be a complete list. Talk with your dietitian about what dietary choices  are best for you. Grains Whole-grain or whole-wheat bread. Whole-grain or whole-wheat pasta. Brown rice. Oatmeal. Quinoa. Bulgur. Whole-grain and low-sodium cereals. Pita bread. Low-fat, low-sodium crackers. Whole-wheat flour tortillas. Vegetables Fresh or frozen vegetables (raw, steamed, roasted, or grilled). Low-sodium or reduced-sodium tomato and vegetable juice. Low-sodium or reduced-sodium tomato sauce and tomato paste. Low-sodium or reduced-sodium canned vegetables. Fruits All fresh, dried, or frozen fruit. Canned fruit in natural juice (without added sugar). Meat and other protein foods Skinless chicken or turkey. Ground chicken or turkey. Pork with fat trimmed off. Fish and seafood. Egg whites. Dried beans, peas, or lentils. Unsalted nuts, nut butters, and seeds. Unsalted canned beans. Lean cuts of beef with fat trimmed off. Low-sodium, lean deli meat. Dairy Low-fat (1%) or fat-free (skim) milk. Fat-free, low-fat, or reduced-fat cheeses. Nonfat, low-sodium ricotta or cottage cheese. Low-fat or nonfat yogurt. Low-fat, low-sodium cheese. Fats and oils Soft margarine without trans fats. Vegetable oil. Low-fat, reduced-fat, or light mayonnaise and salad dressings (reduced-sodium). Canola, safflower, olive, soybean, and sunflower oils. Avocado. Seasoning and other foods Herbs. Spices. Seasoning mixes without salt. Unsalted popcorn and pretzels. Fat-free sweets. What foods are not recommended? The items listed may not be a complete list. Talk with your dietitian about what dietary choices are best for you. Grains Baked goods made with fat, such as croissants, muffins, or some breads. Dry pasta or rice meal packs. Vegetables Creamed or fried vegetables. Vegetables in a cheese sauce. Regular canned vegetables (not low-sodium or reduced-sodium). Regular canned tomato sauce and paste (not low-sodium or reduced-sodium). Regular tomato and vegetable juice (not low-sodium or reduced-sodium). Pickles.  Olives. Fruits Canned fruit in a light or heavy syrup. Fried fruit. Fruit in cream or butter sauce. Meat and other protein foods Fatty cuts of meat. Ribs. Fried meat. Bacon. Sausage. Bologna and other processed lunch meats. Salami. Fatback. Hotdogs. Bratwurst. Salted nuts and seeds. Canned beans with added salt. Canned or smoked fish. Whole eggs or egg yolks. Chicken or turkey with skin. Dairy Whole or 2% milk, cream, and half-and-half. Whole or full-fat cream cheese. Whole-fat or sweetened yogurt. Full-fat cheese. Nondairy creamers. Whipped toppings. Processed cheese and cheese spreads. Fats and oils Butter. Stick margarine. Lard. Shortening. Ghee. Bacon fat. Tropical oils, such as coconut, palm kernel, or palm oil. Seasoning and other foods Salted popcorn and pretzels. Onion salt, garlic salt, seasoned salt, table salt, and sea salt. Worcestershire sauce. Tartar sauce. Barbecue sauce. Teriyaki sauce. Soy sauce, including reduced-sodium. Steak sauce. Canned and packaged gravies. Fish sauce. Oyster sauce. Cocktail sauce. Horseradish that you find on the shelf. Ketchup. Mustard. Meat flavorings and tenderizers. Bouillon cubes. Hot sauce and Tabasco sauce. Premade or packaged marinades. Premade or packaged taco seasonings. Relishes. Regular salad dressings. Where to find more information:  National Heart, Lung, and Blood Institute: www.nhlbi.nih.gov  American Heart Association: www.heart.org Summary  The DASH eating plan is a healthy   eating plan that has been shown to reduce high blood pressure (hypertension). It may also reduce your risk for type 2 diabetes, heart disease, and stroke.  With the DASH eating plan, you should limit salt (sodium) intake to 2,300 mg a day. If you have hypertension, you may need to reduce your sodium intake to 1,500 mg a day.  When on the DASH eating plan, aim to eat more fresh fruits and vegetables, whole grains, lean proteins, low-fat dairy, and heart-healthy  fats.  Work with your health care provider or diet and nutrition specialist (dietitian) to adjust your eating plan to your individual calorie needs. This information is not intended to replace advice given to you by your health care provider. Make sure you discuss any questions you have with your health care provider. Document Released: 09/17/2011 Document Revised: 09/21/2016 Document Reviewed: 09/21/2016 Elsevier Interactive Patient Education  2018 [employer] Inc.  

## 2018-01-20 ENCOUNTER — Ambulatory Visit: Payer: Self-pay | Admitting: Family Medicine

## 2018-08-06 ENCOUNTER — Other Ambulatory Visit: Payer: Self-pay

## 2018-08-06 ENCOUNTER — Emergency Department (HOSPITAL_BASED_OUTPATIENT_CLINIC_OR_DEPARTMENT_OTHER)
Admission: EM | Admit: 2018-08-06 | Discharge: 2018-08-06 | Disposition: A | Discharge status: Home / Self Care | Payer: Self-pay | Attending: Emergency Medicine | Admitting: Emergency Medicine

## 2018-08-06 ENCOUNTER — Encounter (HOSPITAL_BASED_OUTPATIENT_CLINIC_OR_DEPARTMENT_OTHER): Payer: Self-pay | Admitting: Emergency Medicine

## 2018-08-06 ENCOUNTER — Emergency Department (HOSPITAL_BASED_OUTPATIENT_CLINIC_OR_DEPARTMENT_OTHER): Payer: Self-pay

## 2018-08-06 DIAGNOSIS — F17200 Nicotine dependence, unspecified, uncomplicated: Secondary | ICD-10-CM | POA: Insufficient documentation

## 2018-08-06 DIAGNOSIS — I10 Essential (primary) hypertension: Secondary | ICD-10-CM | POA: Insufficient documentation

## 2018-08-06 DIAGNOSIS — J069 Acute upper respiratory infection, unspecified: Secondary | ICD-10-CM

## 2018-08-06 DIAGNOSIS — Z79899 Other long term (current) drug therapy: Secondary | ICD-10-CM | POA: Insufficient documentation

## 2018-08-06 DIAGNOSIS — E876 Hypokalemia: Secondary | ICD-10-CM

## 2018-08-06 LAB — CBC
HCT: 43.4 % (ref 39.0–52.0)
Hemoglobin: 14.7 g/dL (ref 13.0–17.0)
MCH: 31.3 pg (ref 26.0–34.0)
MCHC: 33.9 g/dL (ref 30.0–36.0)
MCV: 92.5 fL (ref 80.0–100.0)
Platelets: 249 10*3/uL (ref 150–400)
RBC: 4.69 MIL/uL (ref 4.22–5.81)
RDW: 14.3 % (ref 11.5–15.5)
WBC: 13.6 10*3/uL — AB (ref 4.0–10.5)
nRBC: 0 % (ref 0.0–0.2)

## 2018-08-06 LAB — URINALYSIS, MICROSCOPIC (REFLEX)

## 2018-08-06 LAB — COMPREHENSIVE METABOLIC PANEL
ALBUMIN: 3.7 g/dL (ref 3.5–5.0)
ALT: 75 U/L — ABNORMAL HIGH (ref 0–44)
AST: 66 U/L — ABNORMAL HIGH (ref 15–41)
Alkaline Phosphatase: 107 U/L (ref 38–126)
Anion gap: 15 (ref 5–15)
BUN: 17 mg/dL (ref 6–20)
CO2: 26 mmol/L (ref 22–32)
Calcium: 9.5 mg/dL (ref 8.9–10.3)
Chloride: 94 mmol/L — ABNORMAL LOW (ref 98–111)
Creatinine, Ser: 1.66 mg/dL — ABNORMAL HIGH (ref 0.61–1.24)
GFR calc Af Amer: 50 mL/min — ABNORMAL LOW (ref >60)
GFR calc non Af Amer: 43 mL/min — ABNORMAL LOW (ref >60)
Glucose, Bld: 138 mg/dL — ABNORMAL HIGH (ref 70–99)
POTASSIUM: 3 mmol/L — AB (ref 3.5–5.1)
Sodium: 135 mmol/L (ref 135–145)
Total Bilirubin: 1.6 mg/dL — ABNORMAL HIGH (ref 0.3–1.2)
Total Protein: 8 g/dL (ref 6.5–8.1)

## 2018-08-06 LAB — URINALYSIS, ROUTINE W REFLEX MICROSCOPIC
Glucose, UA: NEGATIVE mg/dL
KETONES UR: 15 mg/dL — AB
Leukocytes, UA: NEGATIVE
NITRITE: POSITIVE — AB
Protein, ur: >300 mg/dL — AB
Specific Gravity, Urine: >1.03 — ABNORMAL HIGH (ref 1.005–1.030)
pH: 6 (ref 5.0–8.0)

## 2018-08-06 LAB — LIPASE, BLOOD: Lipase: 58 U/L — ABNORMAL HIGH (ref 11–51)

## 2018-08-06 MED ORDER — POTASSIUM CHLORIDE ER 20 MEQ PO TBCR: 20.0000 meq | EXTENDED_RELEASE_TABLET | Freq: Every day | ORAL | 0 refills | Status: DC

## 2018-08-06 MED ORDER — ACETAMINOPHEN 325 MG PO TABS
650.0000 mg | ORAL_TABLET | Freq: Once | ORAL | Status: AC
  Administered 2018-08-06: 650 mg via ORAL
  Filled 2018-08-06: qty 2

## 2018-08-06 MED ORDER — SODIUM CHLORIDE 0.9 % IV BOLUS
1000.0000 mL | Freq: Once | INTRAVENOUS | Status: AC
  Administered 2018-08-06: 1000 mL via INTRAVENOUS

## 2018-08-06 MED ORDER — DOXYCYCLINE HYCLATE 100 MG PO CAPS: 100.0000 mg | ORAL_CAPSULE | Freq: Two times a day (BID) | ORAL | 0 refills | Status: DC

## 2018-08-06 MED ORDER — ONDANSETRON HCL 4 MG/2ML IJ SOLN
4.0000 mg | Freq: Once | INTRAMUSCULAR | Status: AC | PRN
  Administered 2018-08-06: 4 mg via INTRAVENOUS
  Filled 2018-08-06: qty 2

## 2018-08-06 NOTE — ED Provider Notes (Signed)
[facility] EMERGENCY DEPARTMENT Provider Note   CSN: [id number] Arrival date & time: 08/06/18  1055   History   Chief Complaint Chief Complaint  Patient presents with  . Headache  . Diarrhea    HPI Antione Obar is a 62 y.o. male.  HPI  62 year old male presents today with complaints of upper respiratory infection.  Patient notes symptoms started 4 days ago with headache generalized, fever, and productive cough.  Patient notes the first 2 days of the illness were most severe, he notes that symptoms have continued to improve but still feels weak and febrile.  Patient notes productive cough, he notes the addition of diarrhea 3 days ago, notes approximately 4 bowel movements per day, nonbloody with no associated abdominal pain.  Patient denies any close sick contacts, denies any significant sore throat, chest pain or shortness of breath, abdominal pain, rashes, or any urinary symptoms.  Patient notes his only tonic health condition is hypertension. Pt did not receive the influenza vaccine this year.   Past Medical History:  Diagnosis Date  . Alcohol abuse   . Hypertension     Patient Active Problem List   Diagnosis Date Noted  . ANXIETY 08/09/2007  . BACK PAIN, LUMBAR, WITH RADICULOPATHY 12/30/2006  . MELENA, HX OF 12/06/2006  . BACK PAIN 11/14/2006  . HEADACHE 11/01/2006  . HYPERLIPIDEMIA 07/31/2006  . TOBACCO ABUSE 07/31/2006  . Essential hypertension 07/31/2006  . DEVIATED NASAL SEPTUM 07/31/2006  . GERD 07/31/2006  . RENAL INSUFFICIENCY 07/31/2006  . SYNCOPE, HX OF 07/31/2006    Past Surgical History:  Procedure Laterality Date  . BACK SURGERY          Home Medications    Prior to Admission medications   Medication Sig Start Date End Date Taking? Authorizing Provider  amLODipine (NORVASC) 5 MG tablet Take 1 tablet (5 mg total) by mouth daily. 12/23/17   Sharlene Dory, DO  doxycycline (VIBRAMYCIN) 100 MG capsule Take 1 capsule (100 mg  total) by mouth 2 (two) times daily. 08/06/18   Saudia Smyser, Tinnie Gens, PA-C  hydrochlorothiazide (HYDRODIURIL) 25 MG tablet Take 1 tablet (25 mg total) by mouth daily. 12/23/17   Sharlene Dory, DO  potassium chloride 20 MEQ TBCR Take 20 mEq by mouth daily. 08/06/18   Eyvonne Mechanic, PA-C    Family History Family History  Problem Relation Age of Onset  . Cancer Mother   . Heart disease Mother   . Hypertension Father   . Heart disease Father   . Hypertension Sister     Social History Social History   Tobacco Use  . Smoking status: Current Every Day Smoker    Packs/day: 0.50  . Smokeless tobacco: Never Used  Substance Use Topics  . Alcohol use: Yes    Comment: occ  . Drug use: No     Allergies   Hydrocodone-acetaminophen   Review of Systems Review of Systems  All other systems reviewed and are negative.   Physical Exam Updated Vital Signs BP (!) 169/101   Pulse (!) 104   Temp 99.1 F (37.3 C) (Oral)   Resp 13   Ht 5\' 5"  (1.651 m)   Wt 70.3 kg   SpO2 96%   BMI 25.79 kg/m   Physical Exam  Constitutional: He is oriented to person, place, and time. He appears well-developed and well-nourished.  HENT:  Head: Normocephalic and atraumatic.  Oropharynx clear, no erythema, no edema  Eyes: Pupils are equal, round, and reactive to light. Conjunctivae  are normal. Right eye exhibits no discharge. Left eye exhibits no discharge. No scleral icterus.  Neck: Normal range of motion. No JVD present. No tracheal deviation present.  Pulmonary/Chest: Effort normal and breath sounds normal. No stridor. No respiratory distress. He has no wheezes. He has no rales. He exhibits no tenderness.  Abdominal: Soft. He exhibits no distension and no mass. There is no tenderness. There is no rebound and no guarding. No hernia.  Neurological: He is alert and oriented to person, place, and time. Coordination normal.  Psychiatric: He has a normal mood and affect. His behavior is normal.  Judgment and thought content normal.  Nursing note and vitals reviewed.   ED Treatments / Results  Labs (all labs ordered are listed, but only abnormal results are displayed) Labs Reviewed  LIPASE, BLOOD - Abnormal; Notable for the following components:      Result Value   Lipase 58 (*)    All other components within normal limits  COMPREHENSIVE METABOLIC PANEL - Abnormal; Notable for the following components:   Potassium 3.0 (*)    Chloride 94 (*)    Glucose, Bld 138 (*)    Creatinine, Ser 1.66 (*)    AST 66 (*)    ALT 75 (*)    Total Bilirubin 1.6 (*)    GFR calc non Af Amer 43 (*)    GFR calc Af Amer 50 (*)    All other components within normal limits  CBC - Abnormal; Notable for the following components:   WBC 13.6 (*)    All other components within normal limits  URINALYSIS, ROUTINE W REFLEX MICROSCOPIC - Abnormal; Notable for the following components:   Color, Urine ORANGE (*)    Specific Gravity, Urine >1.030 (*)    Hgb urine dipstick MODERATE (*)    Bilirubin Urine MODERATE (*)    Ketones, ur 15 (*)    Protein, ur >300 (*)    Nitrite POSITIVE (*)    All other components within normal limits  URINALYSIS, MICROSCOPIC (REFLEX) - Abnormal; Notable for the following components:   Bacteria, UA MANY (*)    All other components within normal limits  URINE CULTURE    EKG None  Radiology Dg Chest 2 View  Result Date: 08/06/2018 CLINICAL DATA:  Cough, fever, body aches and headaches for 4 days. EXAM: CHEST - 2 VIEW COMPARISON:  11/06/2016 FINDINGS: The cardiac silhouette, and mediastinal and hilar contours are normal. Slight increased interstitial markings and mild peribronchial thickening may suggest bronchitis. No focal infiltrates or effusions. The bony thorax is intact. IMPRESSION: Findings suggest bronchitis or interstitial pneumonitis. No focal infiltrates or effusions. Electronically Signed   By: Rudie Meyer M.D.   On: 08/06/2018 12:38     Procedures Procedures (including critical care time)  Medications Ordered in ED Medications  ondansetron (ZOFRAN) injection 4 mg (4 mg Intravenous Given 08/06/18 1151)  sodium chloride 0.9 % bolus 1,000 mL (0 mLs Intravenous Stopped 08/06/18 1254)  acetaminophen (TYLENOL) tablet 650 mg (650 mg Oral Given 08/06/18 1153)     Initial Impression / Assessment and Plan / ED Course  I have reviewed the triage vital signs and the nursing notes.  Pertinent labs & imaging results that were available during my care of the patient were reviewed by me and considered in my medical decision making (see chart for details).     Labs: lipase, CMP, CBC, UA  Imaging: DG chest 2 view   Consults:  Therapeutics: Saline, Tylenol, Zofran  Discharge  Meds: Doxycycline, potassium  Assessment/Plan: 62 YOM presents today with URI. Pt febrile but very well appearing in no acute distress. Due to ongoing fever, age, duration, and smoking history patient will be treated with antibiotics. Pt also hypokalemic like secondary to decreased PO intake. Pt will follow-up as an outpatient with primary care return immediately with any new or worsening signs or symptoms.  He verbalized understanding and agreement to today's plan had no further questions or concerns.    Final Clinical Impressions(s) / ED Diagnoses   Final diagnoses:  Upper respiratory tract infection, unspecified type  Hypokalemia    ED Discharge Orders         Ordered    potassium chloride 20 MEQ TBCR  Daily     08/06/18 1356    doxycycline (VIBRAMYCIN) 100 MG capsule  2 times daily     08/06/18 1356           Eyvonne Mechanic, PA-C 08/06/18 1405    Jacalyn Lefevre, MD 08/06/18 1433

## 2018-08-06 NOTE — Discharge Instructions (Addendum)
Please read attached information. If you experience any new or worsening signs or symptoms please return to the emergency room for evaluation. Please follow-up with your primary care provider or specialist as discussed. Please use medication prescribed only as directed and discontinue taking if you have any concerning signs or symptoms.   °

## 2018-08-06 NOTE — ED Triage Notes (Signed)
Headache and chills since Wednesday. Diarrhea since Thursday.

## 2018-08-06 NOTE — ED Notes (Signed)
Patient transported to X-ray 

## 2018-08-08 LAB — URINE CULTURE

## 2019-06-27 ENCOUNTER — Other Ambulatory Visit: Payer: Self-pay

## 2019-06-28 ENCOUNTER — Encounter: Payer: Self-pay | Admitting: Family Medicine

## 2019-06-28 ENCOUNTER — Ambulatory Visit (INDEPENDENT_AMBULATORY_CARE_PROVIDER_SITE_OTHER): Payer: Self-pay | Admitting: Family Medicine

## 2019-06-28 VITALS — BP 154/110 | HR 84 | Temp 98.1°F | Ht 65.0 in | Wt 133.2 lb

## 2019-06-28 DIAGNOSIS — I10 Essential (primary) hypertension: Secondary | ICD-10-CM

## 2019-06-28 DIAGNOSIS — R630 Anorexia: Secondary | ICD-10-CM

## 2019-06-28 DIAGNOSIS — N529 Male erectile dysfunction, unspecified: Secondary | ICD-10-CM

## 2019-06-28 MED ORDER — AMLODIPINE BESYLATE 5 MG PO TABS: 5.0000 mg | ORAL_TABLET | Freq: Every day | ORAL | 1 refills | Status: DC

## 2019-06-28 MED ORDER — HYDROCHLOROTHIAZIDE 25 MG PO TABS: 25.0000 mg | ORAL_TABLET | Freq: Every day | ORAL | 1 refills | Status: DC

## 2019-06-28 MED ORDER — SILDENAFIL CITRATE 100 MG PO TABS: 50.0000 mg | ORAL_TABLET | Freq: Every day | ORAL | 2 refills | Status: DC | PRN

## 2019-06-28 NOTE — Progress Notes (Signed)
Chief Complaint  Patient presents with  . Follow-up  . Hypertension  . Weight Loss    Subjective Timothy Chambers is a 63 y.o. male who presents for hypertension follow up. He does not monitor home blood pressures. He is out of his medications.  He was on Norvasc 5 mg daily and hydrochlorothiazide 25 mg daily. Patient has these side effects of medication: none He is adhering to a healthy diet overall. Current exercise: active around house and in yard, walking  Has lost weight which he attributes to excessive workload taking care of his 74 year old mother.  He is also working.  He finds that he does not have time to eat.  Appetite is sometimes affected by some of the duties he must carry out such as cleaning up his mother's waste.  He is not having any blood in his stool, urine, pain, moles, or shortness of breath.  Patient has history of rectal dysfunction.  He would like to try sildenafil.   Past Medical History:  Diagnosis Date  . Alcohol abuse   . Hypertension     Review of Systems Cardiovascular: no chest pain Respiratory:  no shortness of breath  Exam BP (!) 154/110 (BP Location: Left Arm, Patient Position: Sitting, Cuff Size: Normal)   Pulse 84   Temp 98.1 F (36.7 C) (Temporal)   Ht 5\' 5"  (1.651 m)   Wt 133 lb 4 oz (60.4 kg)   SpO2 97%   BMI 22.17 kg/m  General:  well developed, well nourished, in no apparent distress Heart: RRR, no bruits, no LE edema Lungs: clear to auscultation, no accessory muscle use Psych: well oriented with normal range of affect and appropriate judgment/insight  Essential hypertension - Plan: Basic metabolic panel, amLODipine (NORVASC) 5 MG tablet, hydrochlorothiazide (HYDRODIURIL) 25 MG tablet  Erectile dysfunction, unspecified erectile dysfunction type - Plan: sildenafil (VIAGRA) 100 MG tablet  Poor appetite  1-restart Norvasc and hydrochlorothiazide.  Check BMP next week.  Counseled on diet and exercise.  Discussed checking blood  pressures at home, he just got a new monitor. 2-trial sildenafil.  May need to call insurance or look for online coupons. 3-does not seem to be related to anything sinister, more behavioral related to his schedule.  Suggested protein supplementation/meal replacement. F/u in 1 month. The patient voiced understanding and agreement to the plan.  [place], DO 06/28/19  11:57 AM

## 2019-06-28 NOTE — Patient Instructions (Addendum)
Keep the diet clean and stay active.  Give [place] 2-3 business days to get the results of your labs back.   Call your insurance company to see what erectile dysfunction medications they wil cover. Looking for manufacturer coupons online is also reasonable.   Consider a protein supplement/meal replacement if you are pressed for time. If there are any changes like blood in the stool/urine, coughing, or moles, please let me know.   Let us know if you need anything.

## 2019-07-06 ENCOUNTER — Other Ambulatory Visit: Payer: Self-pay

## 2019-07-26 ENCOUNTER — Ambulatory Visit: Payer: Self-pay | Admitting: Family Medicine

## 2020-03-09 ENCOUNTER — Encounter (HOSPITAL_BASED_OUTPATIENT_CLINIC_OR_DEPARTMENT_OTHER): Payer: Self-pay | Admitting: Emergency Medicine

## 2020-03-09 ENCOUNTER — Emergency Department (HOSPITAL_BASED_OUTPATIENT_CLINIC_OR_DEPARTMENT_OTHER)
Admission: EM | Admit: 2020-03-09 | Discharge: 2020-03-09 | Disposition: A | Discharge status: Home / Self Care | Payer: 59 | Attending: Emergency Medicine | Admitting: Emergency Medicine

## 2020-03-09 ENCOUNTER — Other Ambulatory Visit: Payer: Self-pay

## 2020-03-09 DIAGNOSIS — H6121 Impacted cerumen, right ear: Secondary | ICD-10-CM | POA: Diagnosis not present

## 2020-03-09 DIAGNOSIS — I10 Essential (primary) hypertension: Secondary | ICD-10-CM | POA: Insufficient documentation

## 2020-03-09 DIAGNOSIS — Z79899 Other long term (current) drug therapy: Secondary | ICD-10-CM | POA: Insufficient documentation

## 2020-03-09 DIAGNOSIS — F17210 Nicotine dependence, cigarettes, uncomplicated: Secondary | ICD-10-CM | POA: Insufficient documentation

## 2020-03-09 DIAGNOSIS — Z885 Allergy status to narcotic agent status: Secondary | ICD-10-CM | POA: Insufficient documentation

## 2020-03-09 DIAGNOSIS — H9201 Otalgia, right ear: Secondary | ICD-10-CM | POA: Diagnosis present

## 2020-03-09 MED ORDER — CIPROFLOXACIN-DEXAMETHASONE 0.3-0.1 % OT SUSP: 4.0000 [drp] | Freq: Two times a day (BID) | OTIC | 0 refills | Status: DC

## 2020-03-09 MED ORDER — DOCUSATE SODIUM 50 MG/5ML PO LIQD
50.0000 mg | Freq: Once | ORAL | Status: AC
  Administered 2020-03-09: 50 mg via ORAL
  Filled 2020-03-09: qty 10

## 2020-03-09 NOTE — ED Notes (Signed)
ED Provider at bedside for right ear irrigation

## 2020-03-09 NOTE — Discharge Instructions (Signed)
1.  Your ear has been irrigated.  There is no foreign body present.  You have been given a prescription for some eardrops for irritation of the ear canal. 2.  It is vitally important that you restart your blood pressure medications.  Make an appointment for follow-up with your primary care doctor next week. 3.  Return to the emergency department immediately if you develop a headache, problems with your vision, weakness numbness tingling, chest pain, shortness of breath or other concerning symptoms.

## 2020-03-09 NOTE — ED Triage Notes (Signed)
Tip of qtip in R ear

## 2020-03-09 NOTE — ED Provider Notes (Signed)
[facility] EMERGENCY DEPARTMENT Provider Note   CSN: [id number] Arrival date & time: 03/09/20  7829     History Chief Complaint  Patient presents with  . Foreign Body in Ear    Timothy Chambers is a 64 y.o. male.  HPI Patient believes that he broke off the end of a Q-tip in his right ear.  He reports he was cleaning his ear and then the Q-tip snapped and he believes a large part is still in his ear.  He reports it is somewhat uncomfortable.  Is not in severe pain.  He reports his ear feels plugged.  Ports he gets problems with earwax buildup and tries to clean his ears with Q-tips.  Patient has no complaint regarding his blood pressure.  He has a notably high blood pressure.  Patient reports that he has been noncompliant with his medications for several months.  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  He reports that he has the medications at home.  He reports there is no issues with his prescriptions or getting them filled.  He reports he simply does not take them due to feeling like he does not have time to meet his own care needs.  He denies he has been having any problems with headaches, blurred vision, chest pain, shortness of breath.  The only complaint presenting today is his concern for the Q-tip in the ear.    Past Medical History:  Diagnosis Date  . Alcohol abuse   . Hypertension     Patient Active Problem List   Diagnosis Date Noted  . ANXIETY 08/09/2007  . BACK PAIN, LUMBAR, WITH RADICULOPATHY 12/30/2006  . MELENA, HX OF 12/06/2006  . BACK PAIN 11/14/2006  . HEADACHE 11/01/2006  . HYPERLIPIDEMIA 07/31/2006  . TOBACCO ABUSE 07/31/2006  . Essential hypertension 07/31/2006  . DEVIATED NASAL SEPTUM 07/31/2006  . GERD 07/31/2006  . RENAL INSUFFICIENCY 07/31/2006  . SYNCOPE, HX OF 07/31/2006    Past Surgical History:  Procedure Laterality Date    . BACK SURGERY         Family History  Problem Relation Age of Onset  . Cancer Mother   . Heart disease Mother   . Hypertension Father   . Heart disease Father   . Hypertension Sister     Social History   Tobacco Use  . Smoking status: Current Every Day Smoker    Packs/day: 0.50  . Smokeless tobacco: Never Used  Substance Use Topics  . Alcohol use: Yes    Comment: occ  . Drug use: No    Home Medications Prior to Admission medications   Medication Sig Start Date End Date Taking? Authorizing Provider  amLODipine (NORVASC) 5 MG tablet Take 1 tablet (5 mg total) by mouth daily. 06/28/19   Sharlene Dory, DO  ciprofloxacin-dexamethasone (CIPRODEX) OTIC suspension Place 4 drops into the right ear 2 (two) times daily. 03/09/20   Arby Barrette, MD  hydrochlorothiazide (HYDRODIURIL) 25 MG tablet Take 1 tablet (25 mg total) by mouth daily. 06/28/19   Sharlene Dory, DO  sildenafil (VIAGRA) 100 MG tablet Take 0.5-1 tablets (50-100 mg total) by mouth daily as needed for erectile dysfunction. 06/28/19   Sharlene Dory, DO    Allergies    Hydrocodone-acetaminophen  Review of Systems   Review of Systems 10 systems reviewed and negative  except as per HPI. Physical Exam Updated Vital Signs BP (!) 203/118 (BP Location: Right Arm) Comment: noncompliant with meds  Pulse 98   Temp 98 F (36.7 C) (Oral)   Resp 16   Ht 5\' 5"  (1.651 m)   Wt 65.8 kg   SpO2 97%   BMI 24.13 kg/m   Physical Exam Constitutional:      Comments: Patient is alert and nontoxic.  Well-nourished well-developed.  No acute distress.  HENT:     Head: Normocephalic and atraumatic.     Ears:     Comments: Right TM has moist cerumen impaction.  No visible foreign body although ear canal is occluded by moist cerumen.  Left canal is clear.  TM is slightly thickened and sclerotic but no erythema or bulging. Eyes:     Extraocular Movements: Extraocular movements intact.  Cardiovascular:      Rate and Rhythm: Normal rate and regular rhythm.  Pulmonary:     Effort: Pulmonary effort is normal.     Breath sounds: Normal breath sounds.  Musculoskeletal:        General: Normal range of motion.  Skin:    General: Skin is warm and dry.  Neurological:     General: No focal deficit present.     Mental Status: He is oriented to person, place, and time.     Coordination: Coordination normal.  Psychiatric:        Mood and Affect: Mood normal.     ED Results / Procedures / Treatments   Labs (all labs ordered are listed, but only abnormal results are displayed) Labs Reviewed - No data to display  EKG None  Radiology No results found.  Procedures .Ear Cerumen Removal  Date/Time: 03/09/2020 11:43 AM Performed by: 03/11/2020, MD Authorized by: Arby Barrette, MD   Consent:    Consent obtained:  Verbal   Consent given by:  Patient   Risks discussed:  Bleeding, infection, pain, incomplete removal and TM perforation Procedure details:    Location:  R ear   Procedure type: irrigation   Post-procedure details:    Inspection:  TM intact   Hearing quality:  Improved   Patient tolerance of procedure:  Tolerated well, no immediate complications Comments:     Colace placed in the ear for 20 minutes.  Irrigated with ear irrigation device.  Cerumen cleared.  TM intact.  No bleeding or abrasions to the ear canal.   (including critical care time)  Medications Ordered in ED Medications  docusate (COLACE) 50 MG/5ML liquid 50 mg (50 mg Oral Given 03/09/20 1124)    ED Course  I have reviewed the triage vital signs and the nursing notes.  Pertinent labs & imaging results that were available during my care of the patient were reviewed by me and considered in my medical decision making (see chart for details).    MDM Rules/Calculators/A&P                      Patient had concern for having the end of a Q-tip in his ear.  None was visible.  Ear was cleaned and irrigated  free of wax.  No foreign body present.  After irrigation, the TM appears to have a small yellowish bullae in the 4 o'clock position.  Ear canal mildly boggy but free of wax and no trauma.  Will have patient try a course of Ciprodex.  After irrigation he did feel that his hearing was significantly improved.  He  is recommended to follow-up with his PCP for recheck to determine if any need for referral to ENT is indicated.  Patient had no complaints regarding his hypertension.  I had a lengthy discussion with him regarding the necessity and importance of treating his hypertension.  He has been advised of stroke risk and risk of other complications of untreated hypertension.  Patient's review of systems is negative for hypertensive emergency.  He has been noncompliant and due to feeling that he does not have time for taking care of his own needs over the course of the day.  Patient is in possession of his medications and has no problems obtaining his medications or lack of prescriptions.  He advises he will restart them now.  Patient has a PCP at [facility] facility.  Patient reports he will call at the beginning of the week to schedule follow-up and get his medications monitored and attend to his basic health care needs. Final Clinical Impression(s) / ED Diagnoses Final diagnoses:  Impacted cerumen of right ear  Essential hypertension    Rx / DC Orders ED Discharge Orders         Ordered    ciprofloxacin-dexamethasone (CIPRODEX) OTIC suspension  2 times daily     03/09/20 1138           Charlesetta Shanks, MD 03/09/20 1148

## 2020-03-12 MED FILL — CIPROFLOXACIN-DEXAMETHASONE: 0.3-0.1 | 20 days supply | Qty: 8 | Fill #0

## 2020-07-22 IMAGING — CR DG CHEST 2V
2 series · 2 of 2 positions shown · non-contrast
Comparison: 11/06/2016

CLINICAL DATA: Cough, fever, body aches and headaches for 4 days.

EXAM:
CHEST - 2 VIEW

[w chest pa]
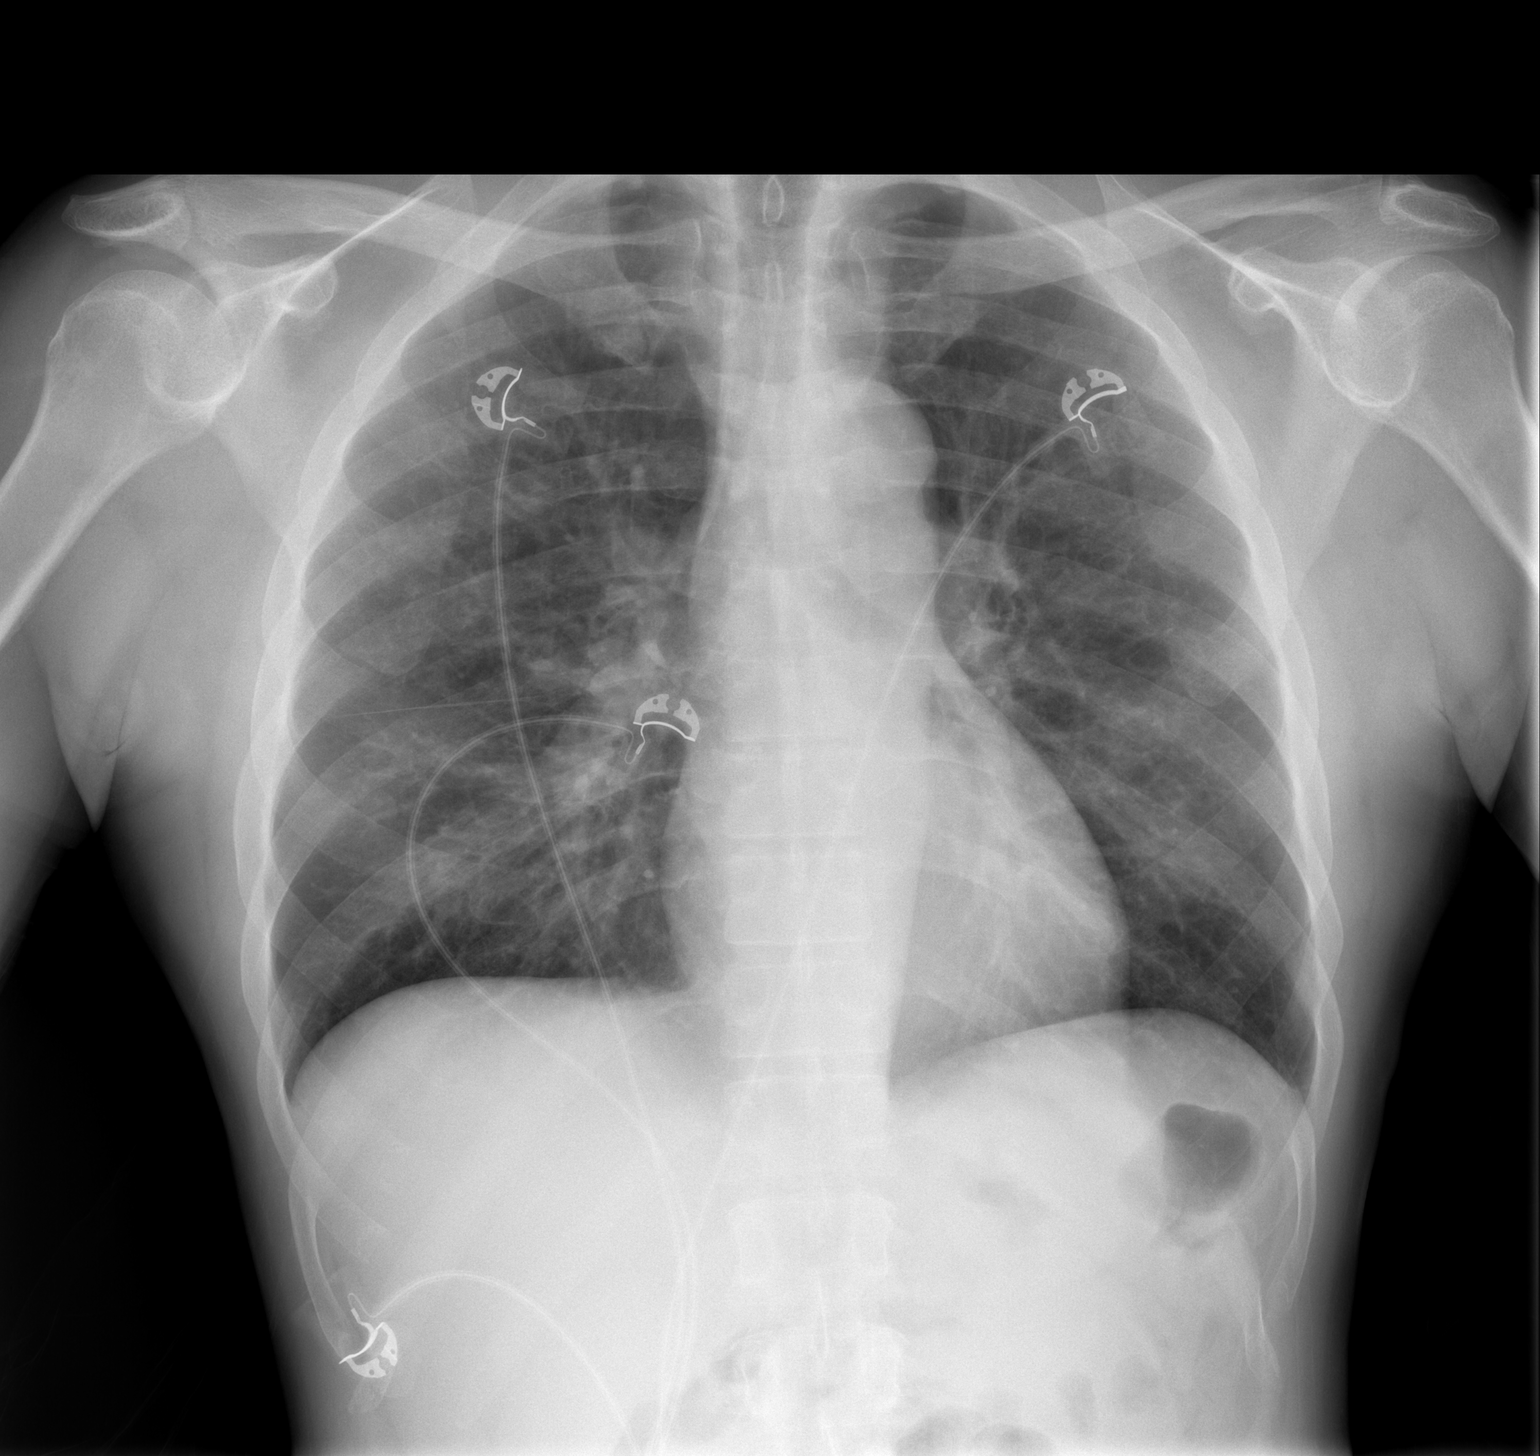

[w chest lat]
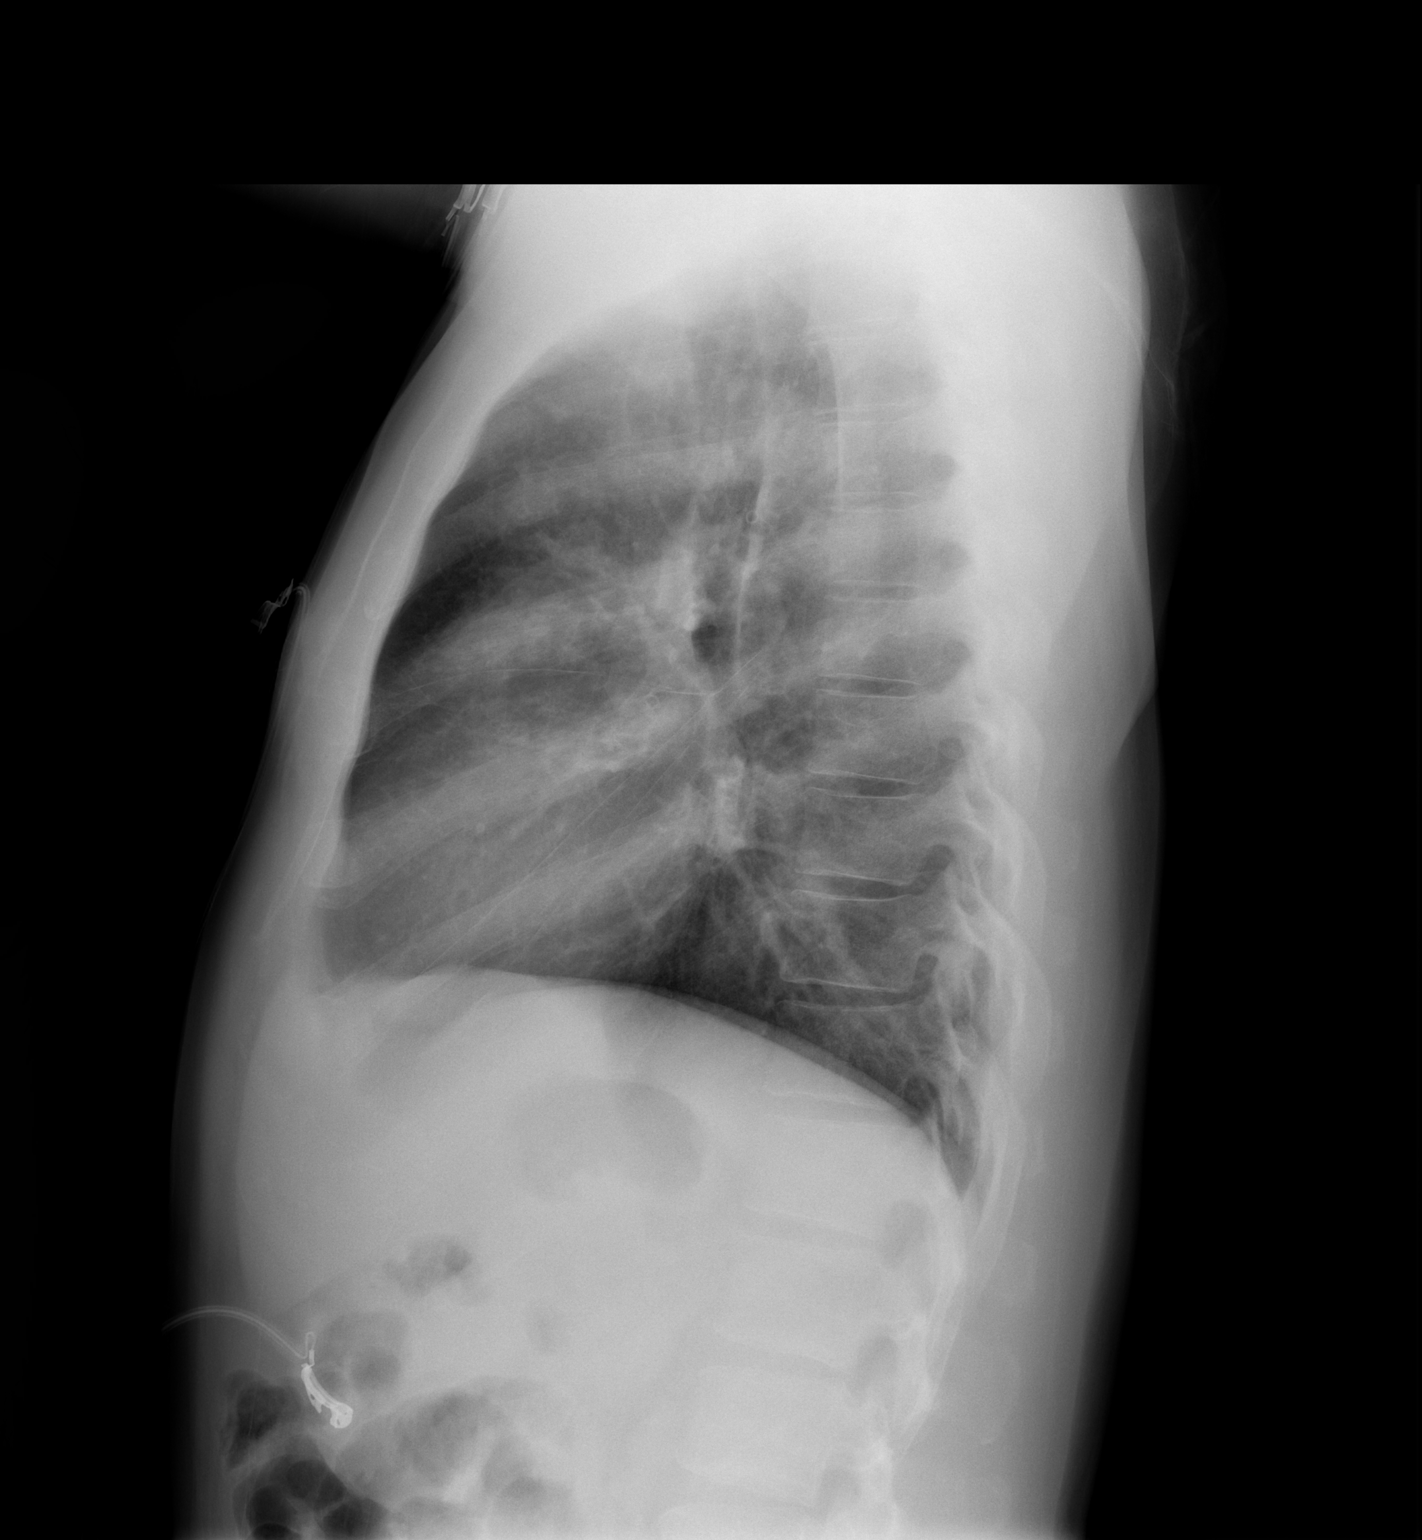

[2 of 2 positions shown; findings below may reference images not displayed]

FINDINGS: The cardiac silhouette, and mediastinal and hilar contours are
normal. Slight increased interstitial markings and mild
peribronchial thickening may suggest bronchitis. No focal
infiltrates or effusions. The bony thorax is intact.
IMPRESSION: Findings suggest bronchitis or interstitial pneumonitis. No focal
infiltrates or effusions.

## 2023-04-06 DIAGNOSIS — G609 Hereditary and idiopathic neuropathy, unspecified: Secondary | ICD-10-CM | POA: Diagnosis not present

## 2023-04-13 ENCOUNTER — Other Ambulatory Visit: Payer: Self-pay

## 2023-04-13 ENCOUNTER — Inpatient Hospital Stay (HOSPITAL_COMMUNITY): Payer: Medicare HMO

## 2023-04-13 ENCOUNTER — Encounter (HOSPITAL_BASED_OUTPATIENT_CLINIC_OR_DEPARTMENT_OTHER): Payer: Self-pay

## 2023-04-13 ENCOUNTER — Inpatient Hospital Stay (HOSPITAL_BASED_OUTPATIENT_CLINIC_OR_DEPARTMENT_OTHER)
Admission: EM | Admit: 2023-04-13 | Discharge: 2023-04-17 | DRG: 377 | Disposition: A | Discharge status: Home / Self Care | Payer: Medicare HMO | Attending: Internal Medicine | Admitting: Internal Medicine

## 2023-04-13 ENCOUNTER — Emergency Department (HOSPITAL_BASED_OUTPATIENT_CLINIC_OR_DEPARTMENT_OTHER): Payer: Medicare HMO

## 2023-04-13 DIAGNOSIS — Z79899 Other long term (current) drug therapy: Secondary | ICD-10-CM

## 2023-04-13 DIAGNOSIS — I82452 Acute embolism and thrombosis of left peroneal vein: Secondary | ICD-10-CM | POA: Diagnosis not present

## 2023-04-13 DIAGNOSIS — R109 Unspecified abdominal pain: Secondary | ICD-10-CM | POA: Diagnosis not present

## 2023-04-13 DIAGNOSIS — Z8249 Family history of ischemic heart disease and other diseases of the circulatory system: Secondary | ICD-10-CM | POA: Diagnosis not present

## 2023-04-13 DIAGNOSIS — K2990 Gastroduodenitis, unspecified, without bleeding: Secondary | ICD-10-CM | POA: Diagnosis not present

## 2023-04-13 DIAGNOSIS — K2980 Duodenitis without bleeding: Secondary | ICD-10-CM | POA: Diagnosis not present

## 2023-04-13 DIAGNOSIS — D125 Benign neoplasm of sigmoid colon: Secondary | ICD-10-CM | POA: Diagnosis present

## 2023-04-13 DIAGNOSIS — I110 Hypertensive heart disease with heart failure: Secondary | ICD-10-CM | POA: Diagnosis not present

## 2023-04-13 DIAGNOSIS — Z91199 Patient's noncompliance with other medical treatment and regimen due to unspecified reason: Secondary | ICD-10-CM | POA: Diagnosis not present

## 2023-04-13 DIAGNOSIS — K2971 Gastritis, unspecified, with bleeding: Secondary | ICD-10-CM | POA: Diagnosis present

## 2023-04-13 DIAGNOSIS — I129 Hypertensive chronic kidney disease with stage 1 through stage 4 chronic kidney disease, or unspecified chronic kidney disease: Secondary | ICD-10-CM | POA: Diagnosis present

## 2023-04-13 DIAGNOSIS — E785 Hyperlipidemia, unspecified: Secondary | ICD-10-CM | POA: Diagnosis present

## 2023-04-13 DIAGNOSIS — K922 Gastrointestinal hemorrhage, unspecified: Secondary | ICD-10-CM | POA: Diagnosis not present

## 2023-04-13 DIAGNOSIS — I82409 Acute embolism and thrombosis of unspecified deep veins of unspecified lower extremity: Secondary | ICD-10-CM | POA: Insufficient documentation

## 2023-04-13 DIAGNOSIS — K259 Gastric ulcer, unspecified as acute or chronic, without hemorrhage or perforation: Secondary | ICD-10-CM | POA: Diagnosis not present

## 2023-04-13 DIAGNOSIS — K529 Noninfective gastroenteritis and colitis, unspecified: Secondary | ICD-10-CM | POA: Diagnosis not present

## 2023-04-13 DIAGNOSIS — K641 Second degree hemorrhoids: Secondary | ICD-10-CM | POA: Diagnosis present

## 2023-04-13 DIAGNOSIS — K6389 Other specified diseases of intestine: Secondary | ICD-10-CM | POA: Diagnosis present

## 2023-04-13 DIAGNOSIS — I10 Essential (primary) hypertension: Secondary | ICD-10-CM

## 2023-04-13 DIAGNOSIS — N170 Acute kidney failure with tubular necrosis: Secondary | ICD-10-CM | POA: Diagnosis present

## 2023-04-13 DIAGNOSIS — I509 Heart failure, unspecified: Secondary | ICD-10-CM | POA: Diagnosis not present

## 2023-04-13 DIAGNOSIS — K209 Esophagitis, unspecified without bleeding: Secondary | ICD-10-CM | POA: Diagnosis not present

## 2023-04-13 DIAGNOSIS — D649 Anemia, unspecified: Secondary | ICD-10-CM

## 2023-04-13 DIAGNOSIS — F1010 Alcohol abuse, uncomplicated: Secondary | ICD-10-CM | POA: Diagnosis not present

## 2023-04-13 DIAGNOSIS — D509 Iron deficiency anemia, unspecified: Secondary | ICD-10-CM

## 2023-04-13 DIAGNOSIS — R600 Localized edema: Secondary | ICD-10-CM

## 2023-04-13 DIAGNOSIS — K635 Polyp of colon: Secondary | ICD-10-CM | POA: Diagnosis not present

## 2023-04-13 DIAGNOSIS — K2970 Gastritis, unspecified, without bleeding: Secondary | ICD-10-CM | POA: Diagnosis not present

## 2023-04-13 DIAGNOSIS — Z885 Allergy status to narcotic agent status: Secondary | ICD-10-CM

## 2023-04-13 DIAGNOSIS — K760 Fatty (change of) liver, not elsewhere classified: Secondary | ICD-10-CM

## 2023-04-13 DIAGNOSIS — D124 Benign neoplasm of descending colon: Secondary | ICD-10-CM | POA: Diagnosis not present

## 2023-04-13 DIAGNOSIS — K921 Melena: Secondary | ICD-10-CM

## 2023-04-13 DIAGNOSIS — D126 Benign neoplasm of colon, unspecified: Secondary | ICD-10-CM | POA: Diagnosis not present

## 2023-04-13 DIAGNOSIS — K649 Unspecified hemorrhoids: Secondary | ICD-10-CM | POA: Diagnosis not present

## 2023-04-13 DIAGNOSIS — E872 Acidosis, unspecified: Secondary | ICD-10-CM | POA: Diagnosis not present

## 2023-04-13 DIAGNOSIS — K449 Diaphragmatic hernia without obstruction or gangrene: Secondary | ICD-10-CM | POA: Diagnosis present

## 2023-04-13 DIAGNOSIS — F17210 Nicotine dependence, cigarettes, uncomplicated: Secondary | ICD-10-CM | POA: Diagnosis present

## 2023-04-13 DIAGNOSIS — R634 Abnormal weight loss: Secondary | ICD-10-CM | POA: Diagnosis present

## 2023-04-13 DIAGNOSIS — K2981 Duodenitis with bleeding: Secondary | ICD-10-CM | POA: Diagnosis present

## 2023-04-13 DIAGNOSIS — K5730 Diverticulosis of large intestine without perforation or abscess without bleeding: Secondary | ICD-10-CM | POA: Diagnosis not present

## 2023-04-13 DIAGNOSIS — R079 Chest pain, unspecified: Secondary | ICD-10-CM | POA: Diagnosis not present

## 2023-04-13 DIAGNOSIS — R5381 Other malaise: Secondary | ICD-10-CM | POA: Diagnosis present

## 2023-04-13 DIAGNOSIS — N1831 Chronic kidney disease, stage 3a: Secondary | ICD-10-CM | POA: Diagnosis present

## 2023-04-13 DIAGNOSIS — K5731 Diverticulosis of large intestine without perforation or abscess with bleeding: Secondary | ICD-10-CM | POA: Diagnosis not present

## 2023-04-13 DIAGNOSIS — I82402 Acute embolism and thrombosis of unspecified deep veins of left lower extremity: Secondary | ICD-10-CM | POA: Diagnosis not present

## 2023-04-13 DIAGNOSIS — K2289 Other specified disease of esophagus: Secondary | ICD-10-CM | POA: Diagnosis present

## 2023-04-13 DIAGNOSIS — D62 Acute posthemorrhagic anemia: Secondary | ICD-10-CM | POA: Diagnosis not present

## 2023-04-13 DIAGNOSIS — Z6821 Body mass index (BMI) 21.0-21.9, adult: Secondary | ICD-10-CM

## 2023-04-13 DIAGNOSIS — E876 Hypokalemia: Secondary | ICD-10-CM | POA: Diagnosis not present

## 2023-04-13 DIAGNOSIS — K6289 Other specified diseases of anus and rectum: Secondary | ICD-10-CM | POA: Diagnosis not present

## 2023-04-13 DIAGNOSIS — Z91148 Patient's other noncompliance with medication regimen for other reason: Secondary | ICD-10-CM

## 2023-04-13 DIAGNOSIS — M79672 Pain in left foot: Secondary | ICD-10-CM | POA: Diagnosis not present

## 2023-04-13 DIAGNOSIS — N179 Acute kidney failure, unspecified: Secondary | ICD-10-CM | POA: Diagnosis not present

## 2023-04-13 LAB — TROPONIN I (HIGH SENSITIVITY)
Troponin I (High Sensitivity): 68 ng/L — ABNORMAL HIGH (ref <18)
Troponin I (High Sensitivity): 64 ng/L — ABNORMAL HIGH (ref <18)

## 2023-04-13 LAB — CBC WITH DIFFERENTIAL/PLATELET
Abs Immature Granulocytes: 0.09 10*3/uL — ABNORMAL HIGH (ref 0.00–0.07)
Abs Immature Granulocytes: 0.12 10*3/uL — ABNORMAL HIGH (ref 0.00–0.07)
Basophils Absolute: 0.1 10*3/uL (ref 0.0–0.1)
Basophils Relative: 0 %
Basophils Relative: 0 %
Eosinophils Absolute: 0.1 10*3/uL (ref 0.0–0.5)
Eosinophils Relative: 1 %
HCT: 18.9 % — ABNORMAL LOW (ref 39.0–52.0)
Hemoglobin: 6.3 g/dL — CL (ref 13.0–17.0)
Immature Granulocytes: 1 %
Immature Granulocytes: 1 %
Lymphocytes Relative: 9 %
Lymphs Abs: 1.1 10*3/uL (ref 0.7–4.0)
MCH: 31.5 pg (ref 26.0–34.0)
MCH: 32.2 pg (ref 26.0–34.0)
MCHC: 33.3 g/dL (ref 30.0–36.0)
MCV: 94.5 fL (ref 80.0–100.0)
Monocytes Absolute: 0.8 10*3/uL (ref 0.1–1.0)
Monocytes Relative: 6 %
Neutro Abs: 10 10*3/uL — ABNORMAL HIGH (ref 1.7–7.7)
Neutrophils Relative %: 83 %
Platelets: 531 10*3/uL — ABNORMAL HIGH (ref 150–400)
RBC: 2 MIL/uL — ABNORMAL LOW (ref 4.22–5.81)
RBC: 2.02 MIL/uL — ABNORMAL LOW (ref 4.22–5.81)
RDW: 14.6 % (ref 11.5–15.5)
WBC: 12.1 10*3/uL — ABNORMAL HIGH (ref 4.0–10.5)
nRBC: 0 % (ref 0.0–0.2)

## 2023-04-13 LAB — NA AND K (SODIUM & POTASSIUM), RAND UR
Potassium Urine: 12 mmol/L
Sodium, Ur: 64 mmol/L

## 2023-04-13 LAB — RETICULOCYTES
Immature Retic Fract: 15.1 % (ref 2.3–15.9)
RBC.: 2.04 MIL/uL — ABNORMAL LOW (ref 4.22–5.81)
Retic Count, Absolute: 47.3 10*3/uL (ref 19.0–186.0)
Retic Ct Pct: 2.3 % (ref 0.4–3.1)

## 2023-04-13 LAB — IRON AND TIBC
Iron: 22 ug/dL — ABNORMAL LOW (ref 45–182)
Saturation Ratios: 11 % — ABNORMAL LOW (ref 17.9–39.5)
TIBC: 204 ug/dL — ABNORMAL LOW (ref 250–450)
UIBC: 182 ug/dL

## 2023-04-13 LAB — COMPREHENSIVE METABOLIC PANEL
ALT: 23 U/L (ref 0–44)
AST: 34 U/L (ref 15–41)
Albumin: 2.6 g/dL — ABNORMAL LOW (ref 3.5–5.0)
Alkaline Phosphatase: 125 U/L (ref 38–126)
Anion gap: 13 (ref 5–15)
BUN: 57 mg/dL — ABNORMAL HIGH (ref 8–23)
CO2: 16 mmol/L — ABNORMAL LOW (ref 22–32)
Calcium: 7.6 mg/dL — ABNORMAL LOW (ref 8.9–10.3)
Chloride: 111 mmol/L (ref 98–111)
Creatinine, Ser: 4.28 mg/dL — ABNORMAL HIGH (ref 0.61–1.24)
GFR, Estimated: 15 mL/min — ABNORMAL LOW (ref >60)
Glucose, Bld: 103 mg/dL — ABNORMAL HIGH (ref 70–99)
Potassium: 2.6 mmol/L — CL (ref 3.5–5.1)
Sodium: 140 mmol/L (ref 135–145)
Total Bilirubin: 0.6 mg/dL (ref 0.3–1.2)
Total Protein: 6.4 g/dL — ABNORMAL LOW (ref 6.5–8.1)

## 2023-04-13 LAB — HIV ANTIBODY (ROUTINE TESTING W REFLEX): HIV Screen 4th Generation wRfx: NONREACTIVE

## 2023-04-13 LAB — URINALYSIS, ROUTINE W REFLEX MICROSCOPIC
Bilirubin Urine: NEGATIVE
Glucose, UA: NEGATIVE mg/dL
Ketones, ur: NEGATIVE mg/dL
Leukocytes,Ua: NEGATIVE
Nitrite: NEGATIVE
Protein, ur: 100 mg/dL — AB
Specific Gravity, Urine: 1.015 (ref 1.005–1.030)
pH: 5.5 (ref 5.0–8.0)

## 2023-04-13 LAB — URINALYSIS, MICROSCOPIC (REFLEX)

## 2023-04-13 LAB — PROTEIN / CREATININE RATIO, URINE
Creatinine, Urine: 61 mg/dL
Protein Creatinine Ratio: 1.18 mg/mg{Cre} — ABNORMAL HIGH (ref 0.00–0.15)
Total Protein, Urine: 72 mg/dL

## 2023-04-13 LAB — TSH: TSH: 1.621 u[IU]/mL (ref 0.350–4.500)

## 2023-04-13 LAB — FOLATE: Folate: 4.4 ng/mL — ABNORMAL LOW (ref >5.9)

## 2023-04-13 LAB — PROTIME-INR
INR: 1 (ref 0.8–1.2)
Prothrombin Time: 13.2 seconds (ref 11.4–15.2)

## 2023-04-13 LAB — BRAIN NATRIURETIC PEPTIDE: B Natriuretic Peptide: 1515.5 pg/mL — ABNORMAL HIGH (ref 0.0–100.0)

## 2023-04-13 LAB — FERRITIN: Ferritin: 327 ng/mL (ref 24–336)

## 2023-04-13 LAB — OCCULT BLOOD X 1 CARD TO LAB, STOOL: Fecal Occult Bld: POSITIVE — AB

## 2023-04-13 LAB — MAGNESIUM: Magnesium: 1 mg/dL — ABNORMAL LOW (ref 1.7–2.4)

## 2023-04-13 LAB — T4, FREE: Free T4: 0.96 ng/dL (ref 0.61–1.12)

## 2023-04-13 LAB — VITAMIN B12: Vitamin B-12: 454 pg/mL (ref 180–914)

## 2023-04-13 MED ORDER — SODIUM CHLORIDE 0.9 % IV BOLUS
500.0000 mL | Freq: Once | INTRAVENOUS | Status: AC
  Administered 2023-04-13: 500 mL via INTRAVENOUS

## 2023-04-13 MED ORDER — ORAL CARE MOUTH RINSE: 15.0000 mL | OROMUCOSAL | Status: DC | PRN

## 2023-04-13 MED ORDER — MAGNESIUM SULFATE 2 GM/50ML IV SOLN
2.0000 g | Freq: Once | INTRAVENOUS | Status: AC
  Administered 2023-04-13: 2 g via INTRAVENOUS
  Filled 2023-04-13: qty 50

## 2023-04-13 MED ORDER — HYDRALAZINE HCL 20 MG/ML IJ SOLN
5.0000 mg | Freq: Four times a day (QID) | INTRAMUSCULAR | Status: DC | PRN
  Administered 2023-04-15 – 2023-04-16 (×4): 5 mg via INTRAVENOUS
  Filled 2023-04-13 (×4): qty 1

## 2023-04-13 MED ORDER — ACETAMINOPHEN 325 MG PO TABS
650.0000 mg | ORAL_TABLET | Freq: Once | ORAL | Status: AC
  Administered 2023-04-13: 650 mg via ORAL
  Filled 2023-04-13: qty 2

## 2023-04-13 MED ORDER — POTASSIUM CHLORIDE 20 MEQ PO PACK
40.0000 meq | PACK | Freq: Two times a day (BID) | ORAL | Status: DC
  Administered 2023-04-13 (×2): 40 meq via ORAL
  Filled 2023-04-13 (×3): qty 2

## 2023-04-13 MED ORDER — HYDROMORPHONE HCL 1 MG/ML IJ SOLN
0.5000 mg | INTRAMUSCULAR | Status: DC | PRN
  Administered 2023-04-13 – 2023-04-16 (×17): 0.5 mg via INTRAVENOUS
  Filled 2023-04-13 (×18): qty 0.5

## 2023-04-13 MED ORDER — HYDRALAZINE HCL 20 MG/ML IJ SOLN
10.0000 mg | Freq: Once | INTRAMUSCULAR | Status: AC
  Administered 2023-04-13: 10 mg via INTRAVENOUS
  Filled 2023-04-13: qty 1

## 2023-04-13 MED ORDER — ACETAMINOPHEN 325 MG PO TABS
650.0000 mg | ORAL_TABLET | Freq: Four times a day (QID) | ORAL | Status: DC | PRN
  Administered 2023-04-13 – 2023-04-16 (×3): 650 mg via ORAL
  Filled 2023-04-13 (×4): qty 2

## 2023-04-13 MED ORDER — PANTOPRAZOLE INFUSION (NEW) - SIMPLE MED
8.0000 mg/h | INTRAVENOUS | Status: DC
  Administered 2023-04-13 – 2023-04-15 (×4): 8 mg/h via INTRAVENOUS
  Filled 2023-04-13 (×8): qty 100

## 2023-04-13 MED ORDER — POTASSIUM CHLORIDE 10 MEQ/100ML IV SOLN
10.0000 meq | INTRAVENOUS | Status: AC
  Administered 2023-04-13: 10 meq via INTRAVENOUS
  Filled 2023-04-13: qty 100

## 2023-04-13 MED ORDER — MAGNESIUM SULFATE 50 % IJ SOLN: 2.0000 g | Freq: Once | INTRAMUSCULAR | Status: DC

## 2023-04-13 MED ORDER — PANTOPRAZOLE SODIUM 40 MG IV SOLR
INTRAVENOUS | Status: AC
  Filled 2023-04-13: qty 40

## 2023-04-13 MED ORDER — SODIUM CHLORIDE 0.9 % IV SOLN: INTRAVENOUS | Status: DC

## 2023-04-13 MED ORDER — MORPHINE SULFATE (PF) 2 MG/ML IV SOLN
2.0000 mg | Freq: Once | INTRAVENOUS | Status: AC
  Administered 2023-04-13: 2 mg via INTRAVENOUS
  Filled 2023-04-13: qty 1

## 2023-04-13 MED ORDER — SODIUM CHLORIDE 0.9 % IV BOLUS: 1000.0000 mL | Freq: Once | INTRAVENOUS | Status: DC

## 2023-04-13 MED ORDER — ACETAMINOPHEN 650 MG RE SUPP: 650.0000 mg | Freq: Four times a day (QID) | RECTAL | Status: DC | PRN

## 2023-04-13 MED ORDER — ENSURE ENLIVE PO LIQD
237.0000 mL | Freq: Two times a day (BID) | ORAL | Status: DC
  Administered 2023-04-14 – 2023-04-15 (×2): 237 mL via ORAL

## 2023-04-13 MED ORDER — PANTOPRAZOLE SODIUM 40 MG IV SOLR: 40.0000 mg | Freq: Two times a day (BID) | INTRAVENOUS | Status: DC

## 2023-04-13 MED ORDER — SODIUM CHLORIDE 0.9% IV SOLUTION: Freq: Once | INTRAVENOUS | Status: DC

## 2023-04-13 MED ORDER — PANTOPRAZOLE 80MG IVPB - SIMPLE MED
80.0000 mg | Freq: Once | INTRAVENOUS | Status: AC
  Administered 2023-04-13: 80 mg via INTRAVENOUS
  Filled 2023-04-13: qty 100

## 2023-04-13 MED ORDER — ONDANSETRON HCL 4 MG/2ML IJ SOLN
4.0000 mg | Freq: Once | INTRAMUSCULAR | Status: AC
  Administered 2023-04-13: 4 mg via INTRAVENOUS
  Filled 2023-04-13: qty 2

## 2023-04-13 MED ORDER — AMLODIPINE BESYLATE 5 MG PO TABS
5.0000 mg | ORAL_TABLET | Freq: Every day | ORAL | Status: DC
  Administered 2023-04-13 – 2023-04-15 (×3): 5 mg via ORAL
  Filled 2023-04-13 (×3): qty 1

## 2023-04-13 NOTE — Consult Note (Signed)
Reason for Consult: AKI/CKD stage IIIa Referring Physician: Rhona Leavens, MD  Timothy Chambers is an 67 y.o. male with a PMH significant for HTN, h/o Etoh abuse, and CKD stage IIIa who presented to [facility] with a 4 day history of bilateral feet and leg swelling which was painful.  He also reports balance issues and a fall last week.  He also reports poor po intake over the last few days.  In the ED, Temp 36.7, P 95, Bp 168/84, SpO2 100%.  Labs notable for WBC 12.1, Hgb 6.3, plt 531, K 2.6, Co2 16, GUN 57, Cr 4.28, Ca 7.6, alb 2.6, BNP 1515, UA small blood, protein 100.  CXR NAD, CT of abdomen and pelvis w/o contrast without hydronephrosis or stones.  Hepatic steatosis and sigmoid diverticulosis without acute diverticulitis.  Lower extremity duplex revealed short-segment acute thrombosis of the left peroneal vein in the mid calf.  No addition LLE DVT.  He was being admitted and transferred to [facility] when he was found on the floor in his room with large amount of maroon colored stool which was heme +.  We were consulted to further evaluate and manage his AKI/CKD.  The trend in Scr is seen below.  Unfortunately there is a 5 year gap in Scr levels as he does not regularly see a physician.  He denies any N/V but did have some diarrhea over the weekend.  He denies any hematochezia, melena, or BRBPR.  He denies any dysuria, pyuria, hematuria, urgency, frequency, or retention.     Trend in Creatinine: Creatinine, Ser  Date/Time Value Ref Range Status  04/13/2023 07:48 AM 4.28 (H) 0.61 - 1.24 mg/dL Final  31/54/0086 76:19 AM 1.66 (H) 0.61 - 1.24 mg/dL Final  50/93/2671 24:58 PM 1.50 (H) 0.50 - 1.35 mg/dL Final  09/98/3382 50:53 PM 1.7 (H) 0.4 - 1.5 mg/dL Final  97/67/3419 37:90 PM 1.30 0.40 - 1.50 mg/dL Final  24/06/7352 29:92 AM 1.35 0.40 - 1.50 mg/dL Final  42/68/3419 62:22 PM 1.38 0.40 - 1.50 mg/dL Final    PMH:   Past Medical History:  Diagnosis Date   Alcohol abuse    Hypertension     PSH:   Past  Surgical History:  Procedure Laterality Date   BACK SURGERY      Allergies:  Allergies  Allergen Reactions   Hydrocodone-Acetaminophen     itch    Medications:   Prior to Admission medications   Medication Sig Start Date End Date Taking? Authorizing Provider  amLODipine (NORVASC) 5 MG tablet Take 1 tablet (5 mg total) by mouth daily. 06/28/19   Sharlene Dory, DO  ciprofloxacin-dexamethasone (CIPRODEX) OTIC suspension Place 4 drops into the right ear 2 (two) times daily. 03/09/20   Arby Barrette, MD  hydrochlorothiazide (HYDRODIURIL) 25 MG tablet Take 1 tablet (25 mg total) by mouth daily. 06/28/19   Sharlene Dory, DO  sildenafil (VIAGRA) 100 MG tablet Take 0.5-1 tablets (50-100 mg total) by mouth daily as needed for erectile dysfunction. 06/28/19   Sharlene Dory, DO    Inpatient medications:  pantoprazole       [START ON 04/16/2023] pantoprazole  40 mg Intravenous Q12H   potassium chloride  40 mEq Oral BID    Discontinued Meds:   Medications Discontinued During This Encounter  Medication Reason   sodium chloride 0.9 % bolus 1,000 mL    magnesium sulfate (IV Push/IM) injection 2 g     Social History:  reports that he has been smoking cigarettes.  He has been smoking an average of .5 packs per day. He has never used smokeless tobacco. He reports that he does not drink alcohol and does not use drugs.  Family History:   Family History  Problem Relation Age of Onset   Cancer Mother    Heart disease Mother    Hypertension Father    Heart disease Father    Hypertension Sister     Pertinent items are noted in HPI. Weight change:   Intake/Output Summary (Last 24 hours) at 04/13/2023 1347 Last data filed at 04/13/2023 1056 Gross per 24 hour  Intake 649.28 ml  Output --  Net 649.28 ml   BP (!) 154/73   Pulse 89   Temp 98.5 F (36.9 C)   Resp 12   Ht 5\' 5"  (1.651 m)   Wt 59 kg   SpO2 100%   BMI 21.63 kg/m  Vitals:   04/13/23 1045 04/13/23  1145 04/13/23 1200 04/13/23 1230  BP:  (!) 156/76 (!) 155/77 (!) 154/73  Pulse: 98 90 89 89  Resp: 12 14 10 12   Temp: 98.5 F (36.9 C)  98.5 F (36.9 C)   TempSrc: Oral     SpO2: 100% 100% 100% 100%  Weight:      Height:         General appearance: alert, cooperative, and no distress Head: Normocephalic, without obvious abnormality, atraumatic Eyes: negative findings: lids and lashes normal, conjunctivae and sclerae normal, and corneas clear Resp: clear to auscultation bilaterally Cardio: no rub and tachycardic at 108 GI: soft, non-tender; bowel sounds normal; no masses,  no organomegaly Extremities: edema trace pedal edema bilaterally and + Homan's sign bilaterally  Labs: Basic Metabolic Panel: Recent Labs  Lab 04/13/23 0748  NA 140  K 2.6*  CL 111  CO2 16*  GLUCOSE 103*  BUN 57*  CREATININE 4.28*  ALBUMIN 2.6*  CALCIUM 7.6*   Liver Function Tests: Recent Labs  Lab 04/13/23 0748  AST 34  ALT 23  ALKPHOS 125  BILITOT 0.6  PROT 6.4*  ALBUMIN 2.6*   No results for input(s): "LIPASE", "AMYLASE" in the last 168 hours. No results for input(s): "AMMONIA" in the last 168 hours. CBC: Recent Labs  Lab 04/13/23 0748 04/13/23 0810  WBC 12.1* 14.0*  NEUTROABS 10.0* 11.4*  HGB 6.3* 6.5*  HCT 18.9* 19.3*  MCV 94.5 95.5  PLT 531* 526*   PT/INR: @LABRCNTIP (inr:5) Cardiac Enzymes: )No results for input(s): "CKTOTAL", "CKMB", "CKMBINDEX", "TROPONINI" in the last 168 hours. CBG: No results for input(s): "GLUCAP" in the last 168 hours.  Iron Studies: No results for input(s): "IRON", "TIBC", "TRANSFERRIN", "FERRITIN" in the last 168 hours.  Xrays/Other Studies: CT ABDOMEN PELVIS WO CONTRAST  Result Date: 04/13/2023 CLINICAL DATA:  Acute renal failure, bilateral lower extremity swelling and pain EXAM: CT ABDOMEN AND PELVIS WITHOUT CONTRAST TECHNIQUE: Multidetector CT imaging of the abdomen and pelvis was performed following the standard protocol without IV contrast.  RADIATION DOSE REDUCTION: This exam was performed according to the departmental dose-optimization program which includes automated exposure control, adjustment of the mA and/or kV according to patient size and/or use of iterative reconstruction technique. COMPARISON:  None Available. FINDINGS: Lower chest: No acute abnormality. Hepatobiliary: No solid liver abnormality is seen. Hepatic steatosis. No gallstones, gallbladder wall thickening, or biliary dilatation. Pancreas: Unremarkable. No pancreatic ductal dilatation or surrounding inflammatory changes. Spleen: Normal in size without significant abnormality. Adrenals/Urinary Tract: Adrenal glands are unremarkable. Kidneys are normal, without renal calculi, solid lesion, or  hydronephrosis. Bladder is unremarkable. Stomach/Bowel: Stomach is within normal limits. Appendix appears normal. No evidence of bowel wall thickening, distention, or inflammatory changes. Sigmoid diverticulosis. Vascular/Lymphatic: Aortic atherosclerosis. No enlarged abdominal or pelvic lymph nodes. Reproductive: No mass or other significant abnormality. Other: No abdominal wall hernia or abnormality. No ascites. Musculoskeletal: No acute or significant osseous findings. IMPRESSION: 1. No acute noncontrast CT findings of the abdomen or pelvis to explain acute renal failure. No urinary tract calculi or hydronephrosis. 2. Hepatic steatosis. 3. Sigmoid diverticulosis without evidence of acute diverticulitis. Aortic Atherosclerosis (ICD10-I70.0). Electronically Signed   By: Jearld Lesch M.D.   On: 04/13/2023 09:47   US Venous Img Lower Unilateral Left  Result Date: 04/13/2023 CLINICAL DATA:  Bilateral foot pain Left lower extremity edema EXAM: LEFT LOWER EXTREMITY VENOUS DOPPLER ULTRASOUND TECHNIQUE: Gray-scale sonography with compression, as well as color and duplex ultrasound, were performed to evaluate the deep venous system(s) from the level of the common femoral vein through the popliteal  and proximal calf veins. COMPARISON:  None Available. FINDINGS: VENOUS Short segment thrombosis of the peroneal vein in the mid calf which is expanded and noncompressible with no color flow. Normal compressibility of the common femoral, superficial femoral, and popliteal veins. Visualized portions of profunda femoral vein and great saphenous vein unremarkable. No additional filling defects to suggest DVT on grayscale or color Doppler imaging. Doppler waveforms show otherwise normal direction of venous flow, normal respiratory plasticity and response to augmentation. Limited views of the contralateral common femoral vein are unremarkable. OTHER None. Limitations: none IMPRESSION: Short-segment acute thrombosis of the left peroneal vein in the mid calf. No additional left lower extremity DVT. Electronically Signed   By: Acquanetta Belling M.D.   On: 04/13/2023 09:46   DG Chest Portable 1 View  Result Date: 04/13/2023 CLINICAL DATA:  Three day history of bilateral lower extremity swelling and pain EXAM: PORTABLE CHEST 1 VIEW COMPARISON:  Chest radiograph dated 08/06/2018 FINDINGS: Normal lung volumes. No focal consolidations. No pleural effusion or pneumothorax. The heart size and mediastinal contours are within normal limits. No acute osseous abnormality. IMPRESSION: No active disease. Electronically Signed   By: Agustin Cree M.D.   On: 04/13/2023 09:44     Assessment/Plan:  AKI/CKD stage IIIa vs progressive CKD stage IV - unknown baseline Scr as no labs since 2019.  Current presentation, likely ischemic ATN in setting of poor po intake and symptomatic ABLA with Hgb of 6.5.  CT of abd and pelvis without obstruction.  UA with small blood and protein.  No uremic symptoms.  No indication for dialysis at this time.  Will follow UOP and SCr after he receives blood transfusion.  Will order acute GN workup given blood and protein in his urine and thrombosis of his left peroneal vein.    Avoid nephrotoxic medications including  NSAIDs and iodinated intravenous contrast exposure unless the latter is absolutely indicated.  Preferred narcotic agents for pain control are hydromorphone, fentanyl, and methadone. Morphine should not be used. Avoid Baclofen and avoid oral sodium phosphate and magnesium citrate based laxatives / bowel preps. Continue strict Input and Output monitoring. Will monitor the patient closely with you and intervene or adjust therapy as indicated by changes in clinical status/labs  ABLA - had episode of melena at [facility], heme +.  Will need blood transfusion and GI evaluation. Left peroneal vein thrombosis - cannot use anticoagulation given GIB.  Workup per primary svc. Hypokalemia - replete and follow.  Will also check  Mg level Etoh abuse - he admits to drinking again but thinks the last drink was 2 weeks ago, although he said he drinks so he can sleep due to the pain in his feet which just started this week.   Julien Nordmann Devlyn Retter 04/13/2023, 1:47 PM

## 2023-04-13 NOTE — ED Notes (Signed)
Pt called out for assistance. Upon entering room pt was in bed and large amount of loose maroon colored stool by the trash can at door.  Pt says he was trying to go to BR, but had not call for assistance. Appeared confused briefly, but able to answer appropriately Advised not to get OOB without asking for assistance. Stool obtained and sent for hemoccult. [facility] into transfer

## 2023-04-13 NOTE — ED Notes (Signed)
Date and time results received: 04/13/23 0912   Test: hgb Critical Value: 6.5  Name of Provider Notified: gray, samuel  Orders Received? Or Actions Taken?: no orders received

## 2023-04-13 NOTE — Progress Notes (Signed)
Timothy Chambers is alert and oriented x4. Refused to go to IR due to not have eaten. MD Chipper Herb made aware.  1623 - Timothy Chambers expressed anger over having diet switched from heart healthy to full liquid. This nurse explained to him that due to his GIB he has been switched to a full liquid diet and may require an EGD. He stated "I don't care, I ordered my sandwich and I want to eat! How long does it take to make my food?" This nurse explained that a full liquid tray has been order and will come to his room. Pt stated, "I don't want that, I want my sandwich."   This nurse communicated with MD Chipper Herb about pt wanting to eat solid food, and relayed that due to the GIB he will be on full liquid diet and may have EGD.   1630 - Timothy Chambers refused type and screen MD Zhang notified.

## 2023-04-13 NOTE — Consult Note (Signed)
Chief Complaint: DVT, unable to anticoagulate  Referring Provider(s): Mikey College  Supervising Physician: Gilmer Mor  Patient Status: [facility] - In-pt  History of Present Illness: Timothy Chambers is a 67 y.o. male with medical issues including hypertension and alcohol abuse.  He has been having left leg swelling for a few days.  Venous doppler confirmed peroneal DVT.   He developed a Lower GI bleed.  He also has CKD w/ creatinine of 4 - not on dialysis.  We are asked to place an IVC Filter.  Patient is Full Code  Past Medical History:  Diagnosis Date   Alcohol abuse    Hypertension     Past Surgical History:  Procedure Laterality Date   BACK SURGERY      Allergies: Hydrocodone-acetaminophen  Medications: Prior to Admission medications   Medication Sig Start Date End Date Taking? Authorizing Provider  amLODipine (NORVASC) 5 MG tablet Take 1 tablet (5 mg total) by mouth daily. 06/28/19   Sharlene Dory, DO  ciprofloxacin-dexamethasone (CIPRODEX) OTIC suspension Place 4 drops into the right ear 2 (two) times daily. 03/09/20   Arby Barrette, MD  hydrochlorothiazide (HYDRODIURIL) 25 MG tablet Take 1 tablet (25 mg total) by mouth daily. 06/28/19   Sharlene Dory, DO  sildenafil (VIAGRA) 100 MG tablet Take 0.5-1 tablets (50-100 mg total) by mouth daily as needed for erectile dysfunction. 06/28/19   Sharlene Dory, DO     Family History  Problem Relation Age of Onset   Cancer Mother    Heart disease Mother    Hypertension Father    Heart disease Father    Hypertension Sister     Social History   Socioeconomic History   Marital status: Divorced    Spouse name: Not on file   Number of children: Not on file   Years of education: Not on file   Highest education level: Not on file  Occupational History   Not on file  Tobacco Use   Smoking status: Some Days    Packs/day: .5    Types: Cigarettes   Smokeless tobacco: Never  Vaping  Use   Vaping Use: Never used  Substance and Sexual Activity   Alcohol use: Never    Comment: occ   Drug use: No   Sexual activity: Never  Other Topics Concern   Not on file  Social History Narrative   Not on file   Social Determinants of Health   Financial Resource Strain: Not on file  Food Insecurity: No Food Insecurity (04/13/2023)   Hunger Vital Sign    Worried About Running Out of Food in the Last Year: Never true    Ran Out of Food in the Last Year: Never true  Transportation Needs: No Transportation Needs (04/13/2023)   PRAPARE - Administrator, Civil Service (Medical): No    Lack of Transportation (Non-Medical): No  Physical Activity: Not on file  Stress: Not on file  Social Connections: Not on file     Review of Systems: A 12 point ROS discussed and pertinent positives are indicated in the HPI above.  All other systems are negative.    Vital Signs: BP (!) 169/92   Pulse (!) 107   Temp 98.6 F (37 C) (Oral)   Resp 15   Ht 5\' 5"  (1.651 m)   Wt 130 lb (59 kg)   SpO2 100%   BMI 21.63 kg/m   Advance Care Plan: The advanced care place/surrogate decision maker  was discussed at the time of visit and the patient did not wish to discuss or was not able to name a surrogate decision maker or provide an advance care plan.  Physical Exam Vitals reviewed.  Constitutional:      Appearance: Normal appearance.  HENT:     Head: Normocephalic and atraumatic.  Eyes:     Extraocular Movements: Extraocular movements intact.  Cardiovascular:     Rate and Rhythm: Normal rate and regular rhythm.  Pulmonary:     Effort: Pulmonary effort is normal. No respiratory distress.     Breath sounds: Normal breath sounds.  Abdominal:     Palpations: Abdomen is soft.  Musculoskeletal:        General: Normal range of motion.     Cervical back: Normal range of motion.  Skin:    General: Skin is warm and dry.  Neurological:     General: No focal deficit present.     Mental  Status: He is alert and oriented to person, place, and time.  Psychiatric:        Mood and Affect: Mood normal.        Behavior: Behavior normal.        Thought Content: Thought content normal.        Judgment: Judgment normal.     Imaging: CT ABDOMEN PELVIS WO CONTRAST  Result Date: 04/13/2023 CLINICAL DATA:  Acute renal failure, bilateral lower extremity swelling and pain EXAM: CT ABDOMEN AND PELVIS WITHOUT CONTRAST TECHNIQUE: Multidetector CT imaging of the abdomen and pelvis was performed following the standard protocol without IV contrast. RADIATION DOSE REDUCTION: This exam was performed according to the departmental dose-optimization program which includes automated exposure control, adjustment of the mA and/or kV according to patient size and/or use of iterative reconstruction technique. COMPARISON:  None Available. FINDINGS: Lower chest: No acute abnormality. Hepatobiliary: No solid liver abnormality is seen. Hepatic steatosis. No gallstones, gallbladder wall thickening, or biliary dilatation. Pancreas: Unremarkable. No pancreatic ductal dilatation or surrounding inflammatory changes. Spleen: Normal in size without significant abnormality. Adrenals/Urinary Tract: Adrenal glands are unremarkable. Kidneys are normal, without renal calculi, solid lesion, or hydronephrosis. Bladder is unremarkable. Stomach/Bowel: Stomach is within normal limits. Appendix appears normal. No evidence of bowel wall thickening, distention, or inflammatory changes. Sigmoid diverticulosis. Vascular/Lymphatic: Aortic atherosclerosis. No enlarged abdominal or pelvic lymph nodes. Reproductive: No mass or other significant abnormality. Other: No abdominal wall hernia or abnormality. No ascites. Musculoskeletal: No acute or significant osseous findings. IMPRESSION: 1. No acute noncontrast CT findings of the abdomen or pelvis to explain acute renal failure. No urinary tract calculi or hydronephrosis. 2. Hepatic steatosis. 3.  Sigmoid diverticulosis without evidence of acute diverticulitis. Aortic Atherosclerosis (ICD10-I70.0). Electronically Signed   By: Jearld Lesch M.D.   On: 04/13/2023 09:47   US Venous Img Lower Unilateral Left  Result Date: 04/13/2023 CLINICAL DATA:  Bilateral foot pain Left lower extremity edema EXAM: LEFT LOWER EXTREMITY VENOUS DOPPLER ULTRASOUND TECHNIQUE: Gray-scale sonography with compression, as well as color and duplex ultrasound, were performed to evaluate the deep venous system(s) from the level of the common femoral vein through the popliteal and proximal calf veins. COMPARISON:  None Available. FINDINGS: VENOUS Short segment thrombosis of the peroneal vein in the mid calf which is expanded and noncompressible with no color flow. Normal compressibility of the common femoral, superficial femoral, and popliteal veins. Visualized portions of profunda femoral vein and great saphenous vein unremarkable. No additional filling defects to suggest DVT on grayscale or color  Doppler imaging. Doppler waveforms show otherwise normal direction of venous flow, normal respiratory plasticity and response to augmentation. Limited views of the contralateral common femoral vein are unremarkable. OTHER None. Limitations: none IMPRESSION: Short-segment acute thrombosis of the left peroneal vein in the mid calf. No additional left lower extremity DVT. Electronically Signed   By: Acquanetta Belling M.D.   On: 04/13/2023 09:46   DG Chest Portable 1 View  Result Date: 04/13/2023 CLINICAL DATA:  Three day history of bilateral lower extremity swelling and pain EXAM: PORTABLE CHEST 1 VIEW COMPARISON:  Chest radiograph dated 08/06/2018 FINDINGS: Normal lung volumes. No focal consolidations. No pleural effusion or pneumothorax. The heart size and mediastinal contours are within normal limits. No acute osseous abnormality. IMPRESSION: No active disease. Electronically Signed   By: Agustin Cree M.D.   On: 04/13/2023 09:44     Labs:  CBC: Recent Labs    04/13/23 0748 04/13/23 0810  WBC 12.1* 14.0*  HGB 6.3* 6.5*  HCT 18.9* 19.3*  PLT 531* 526*    COAGS: Recent Labs    04/13/23 0750  INR 1.0    BMP: Recent Labs    04/13/23 0748  NA 140  K 2.6*  CL 111  CO2 16*  GLUCOSE 103*  BUN 57*  CALCIUM 7.6*  CREATININE 4.28*  GFRNONAA 15*    LIVER FUNCTION TESTS: Recent Labs    04/13/23 0748  BILITOT 0.6  AST 34  ALT 23  ALKPHOS 125  PROT 6.4*  ALBUMIN 2.6*    TUMOR MARKERS: No results for input(s): "AFPTM", "CEA", "CA199", "CHROMGRNA" in the last 8760 hours.  Assessment and Plan:  Peroneal DVT in the setting of GI Bleed - unable to anticoagulate.  Will proceed with venacavagram using CO2 due to renal function and place an IVC Filter.  Risks and benefits discussed with the patient including, but not limited to bleeding, infection, contrast induced renal failure, filter fracture or migration which can lead to emergency surgery or even death, strut penetration with damage or irritation to adjacent structures and caval thrombosis.  All of the patient's questions were answered, patient is agreeable to proceed. Consent signed and in chart.  Thank you for allowing our service to participate in Keymon Sandahl 's care.  Electronically Signed: Gwynneth Macleod, PA-C   04/13/2023, 3:19 PM      I spent a total of 40 Minutes  in face to face in clinical consultation, greater than 50% of which was counseling/coordinating care for IVC filter.

## 2023-04-13 NOTE — ED Notes (Signed)
Pt refused rectal exam 

## 2023-04-13 NOTE — ED Provider Notes (Signed)
[facility] Provider Note   CSN: [id number] Arrival date & time: 04/13/23  4098     History  Chief Complaint  Patient presents with   Leg Swelling    Timothy Chambers is a 67 y.o. male.  Has been an issue for 4 days. Went to podiatrist last week, 2/2 to issues with pain in foot. Podiatrist thought it was neuropathy and gave him gabapentin, which he didn't like. Woke up a couple days later with both feet swollen, feeling like he couldn't walk on them 2/2 pain. Is also noting that he feels off balance like he's drunk, for last 3 days. Note's he fell a couple days ago, but did not hit head or injure himself. Denies any breathing issues or issues laying flat. Patient also notes he's had poor fluid intake for the last few days.     The history is provided by the patient.     Home Medications Prior to Admission medications   Medication Sig Start Date End Date Taking? Authorizing Provider  amLODipine (NORVASC) 5 MG tablet Take 1 tablet (5 mg total) by mouth daily. 06/28/19   Sharlene Dory, DO  ciprofloxacin-dexamethasone (CIPRODEX) OTIC suspension Place 4 drops into the right ear 2 (two) times daily. 03/09/20   Arby Barrette, MD  hydrochlorothiazide (HYDRODIURIL) 25 MG tablet Take 1 tablet (25 mg total) by mouth daily. 06/28/19   Sharlene Dory, DO  sildenafil (VIAGRA) 100 MG tablet Take 0.5-1 tablets (50-100 mg total) by mouth daily as needed for erectile dysfunction. 06/28/19   Sharlene Dory, DO      Allergies    Hydrocodone-acetaminophen    Review of Systems   Review of Systems  Constitutional:  Positive for fatigue.  Respiratory:  Negative for shortness of breath.   Cardiovascular:  Positive for leg swelling.  Gastrointestinal:  Negative for abdominal pain.    Physical Exam Updated Vital Signs BP (!) 168/84   Pulse 95   Temp 98.1 F (36.7 C)   Resp 14   Ht 5\' 5"  (1.651 m)   Wt 59 kg   SpO2 100%    BMI 21.63 kg/m  Physical Exam Constitutional:      General: He is not in acute distress.    Appearance: Normal appearance. He is not ill-appearing.  Cardiovascular:     Rate and Rhythm: Normal rate and regular rhythm.     Pulses: Normal pulses.     Heart sounds: Normal heart sounds. No murmur heard.    No friction rub. No gallop.  Pulmonary:     Effort: Pulmonary effort is normal. No respiratory distress.     Breath sounds: Normal breath sounds. No stridor. No wheezing, rhonchi or rales.  Abdominal:     General: Bowel sounds are normal. There is no distension.     Palpations: Abdomen is soft. There is no mass.     Tenderness: There is no abdominal tenderness. There is no guarding.  Neurological:     General: No focal deficit present.     Mental Status: He is alert. Mental status is at baseline.     Cranial Nerves: No cranial nerve deficit.     Sensory: No sensory deficit.     Motor: No weakness.  Psychiatric:        Mood and Affect: Mood normal.        Behavior: Behavior normal.     ED Results / Procedures / Treatments   Labs (all labs  ordered are listed, but only abnormal results are displayed) Labs Reviewed  CBC WITH DIFFERENTIAL/PLATELET - Abnormal; Notable for the following components:      Result Value   WBC 12.1 (*)    RBC 2.00 (*)    Hemoglobin 6.3 (*)    HCT 18.9 (*)    Platelets 531 (*)    Neutro Abs 10.0 (*)    Abs Immature Granulocytes 0.09 (*)    All other components within normal limits  COMPREHENSIVE METABOLIC PANEL - Abnormal; Notable for the following components:   Potassium 2.6 (*)    CO2 16 (*)    Glucose, Bld 103 (*)    BUN 57 (*)    Creatinine, Ser 4.28 (*)    Calcium 7.6 (*)    Total Protein 6.4 (*)    Albumin 2.6 (*)    GFR, Estimated 15 (*)    All other components within normal limits  BRAIN NATRIURETIC PEPTIDE - Abnormal; Notable for the following components:   B Natriuretic Peptide 1,515.5 (*)    All other components within normal  limits  RETICULOCYTES - Abnormal; Notable for the following components:   RBC. 2.04 (*)    All other components within normal limits  CBC WITH DIFFERENTIAL/PLATELET - Abnormal; Notable for the following components:   WBC 14.0 (*)    RBC 2.02 (*)    Hemoglobin 6.5 (*)    HCT 19.3 (*)    Platelets 526 (*)    Neutro Abs 11.4 (*)    Abs Immature Granulocytes 0.12 (*)    All other components within normal limits  URINALYSIS, ROUTINE W REFLEX MICROSCOPIC - Abnormal; Notable for the following components:   Hgb urine dipstick SMALL (*)    Protein, ur 100 (*)    All other components within normal limits  MAGNESIUM - Abnormal; Notable for the following components:   Magnesium 1.0 (*)    All other components within normal limits  URINALYSIS, MICROSCOPIC (REFLEX) - Abnormal; Notable for the following components:   Bacteria, UA RARE (*)    All other components within normal limits  TROPONIN I (HIGH SENSITIVITY) - Abnormal; Notable for the following components:   Troponin I (High Sensitivity) 68 (*)    All other components within normal limits  PROTIME-INR  TSH  T4, FREE  VITAMIN B12  FOLATE  IRON AND TIBC  FERRITIN  NA AND K (SODIUM & POTASSIUM), RAND UR  CREATININE, URINE, RANDOM  TROPONIN I (HIGH SENSITIVITY)    EKG EKG Interpretation Date/Time:  Tuesday April 13 2023 08:30:58 EDT Ventricular Rate:  92 PR Interval:  140 QRS Duration:  92 QT Interval:  445 QTC Calculation: 551 R Axis:   -35  Text Interpretation: Sinus rhythm Atrial premature complex Probable left atrial enlargement LVH with secondary repolarization abnormality Anterior infarct, old Prolonged QT interval Baseline wander in lead(s) V4 similar to prior Interpretation limited secondary to artifact Confirmed by Tanda Rockers ([id number]) on 04/13/2023 8:34:19 AM  Radiology CT ABDOMEN PELVIS WO CONTRAST  Result Date: 04/13/2023 CLINICAL DATA:  Acute renal failure, bilateral lower extremity swelling and pain EXAM: CT ABDOMEN  AND PELVIS WITHOUT CONTRAST TECHNIQUE: Multidetector CT imaging of the abdomen and pelvis was performed following the standard protocol without IV contrast. RADIATION DOSE REDUCTION: This exam was performed according to the departmental dose-optimization program which includes automated exposure control, adjustment of the mA and/or kV according to patient size and/or use of iterative reconstruction technique. COMPARISON:  None Available. FINDINGS: Lower chest: No acute abnormality.  Hepatobiliary: No solid liver abnormality is seen. Hepatic steatosis. No gallstones, gallbladder wall thickening, or biliary dilatation. Pancreas: Unremarkable. No pancreatic ductal dilatation or surrounding inflammatory changes. Spleen: Normal in size without significant abnormality. Adrenals/Urinary Tract: Adrenal glands are unremarkable. Kidneys are normal, without renal calculi, solid lesion, or hydronephrosis. Bladder is unremarkable. Stomach/Bowel: Stomach is within normal limits. Appendix appears normal. No evidence of bowel wall thickening, distention, or inflammatory changes. Sigmoid diverticulosis. Vascular/Lymphatic: Aortic atherosclerosis. No enlarged abdominal or pelvic lymph nodes. Reproductive: No mass or other significant abnormality. Other: No abdominal wall hernia or abnormality. No ascites. Musculoskeletal: No acute or significant osseous findings. IMPRESSION: 1. No acute noncontrast CT findings of the abdomen or pelvis to explain acute renal failure. No urinary tract calculi or hydronephrosis. 2. Hepatic steatosis. 3. Sigmoid diverticulosis without evidence of acute diverticulitis. Aortic Atherosclerosis (ICD10-I70.0). Electronically Signed   By: Jearld Lesch M.D.   On: 04/13/2023 09:47   US Venous Img Lower Unilateral Left  Result Date: 04/13/2023 CLINICAL DATA:  Bilateral foot pain Left lower extremity edema EXAM: LEFT LOWER EXTREMITY VENOUS DOPPLER ULTRASOUND TECHNIQUE: Gray-scale sonography with compression, as  well as color and duplex ultrasound, were performed to evaluate the deep venous system(s) from the level of the common femoral vein through the popliteal and proximal calf veins. COMPARISON:  None Available. FINDINGS: VENOUS Short segment thrombosis of the peroneal vein in the mid calf which is expanded and noncompressible with no color flow. Normal compressibility of the common femoral, superficial femoral, and popliteal veins. Visualized portions of profunda femoral vein and great saphenous vein unremarkable. No additional filling defects to suggest DVT on grayscale or color Doppler imaging. Doppler waveforms show otherwise normal direction of venous flow, normal respiratory plasticity and response to augmentation. Limited views of the contralateral common femoral vein are unremarkable. OTHER None. Limitations: none IMPRESSION: Short-segment acute thrombosis of the left peroneal vein in the mid calf. No additional left lower extremity DVT. Electronically Signed   By: Acquanetta Belling M.D.   On: 04/13/2023 09:46   DG Chest Portable 1 View  Result Date: 04/13/2023 CLINICAL DATA:  Three day history of bilateral lower extremity swelling and pain EXAM: PORTABLE CHEST 1 VIEW COMPARISON:  Chest radiograph dated 08/06/2018 FINDINGS: Normal lung volumes. No focal consolidations. No pleural effusion or pneumothorax. The heart size and mediastinal contours are within normal limits. No acute osseous abnormality. IMPRESSION: No active disease. Electronically Signed   By: Agustin Cree M.D.   On: 04/13/2023 09:44    Procedures Procedures    Medications Ordered in ED Medications  potassium chloride 10 mEq in 100 mL IVPB (10 mEq Intravenous Not Given 04/13/23 1024)  potassium chloride (KLOR-CON) packet 40 mEq (40 mEq Oral Given 04/13/23 0855)  pantoprozole (PROTONIX) 80 mg /NS 100 mL infusion (8 mg/hr Intravenous New Bag/Given 04/13/23 1015)  pantoprazole (PROTONIX) injection 40 mg (has no administration in time range)   pantoprazole (PROTONIX) 40 MG injection (  Not Given 04/13/23 1016)  sodium chloride 0.9 % bolus 500 mL (500 mLs Intravenous New Bag/Given 04/13/23 0854)  magnesium sulfate IVPB 2 g 50 mL (0 g Intravenous Stopped 04/13/23 1018)  acetaminophen (TYLENOL) tablet 650 mg (650 mg Oral Not Given 04/13/23 0857)  pantoprazole (PROTONIX) 80 mg /NS 100 mL IVPB (0 mg Intravenous Stopped 04/13/23 1014)  hydrALAZINE (APRESOLINE) injection 10 mg (10 mg Intravenous Given 04/13/23 0935)  morphine (PF) 2 MG/ML injection 2 mg (2 mg Intravenous Given 04/13/23 0936)  ondansetron (ZOFRAN) injection 4 mg (4 mg  Intravenous Given 04/13/23 0936)    ED Course/ Medical Decision Making/ A&P Clinical Course as of 04/13/23 1215  Tue Apr 13, 2023  0830 Labs returned w/ BNP 1515.5 [BS]  0830 Brain natriuretic peptide(!) [BS]  0830 B Natriuretic Peptide(!): 1,515.5 BNP elevated, patient is volume overloaded on exam. Some concern for acute renal failure.  [BS]  0831 Potassium(!!): 2.6 K low, concerning for renal dysfunction. Will replete w/ 80 meq.  [BS]  0832 Hemoglobin(!!): 6.3 Hgb low, will obtain anemia studies. Plan to transfuse once admitted as patient asymptomatic at this time.  [BS]  [id number] Creatinine(!): 4.28 Elevate Cr, last noted at [BS]  0833 Creatinine(!): 4.28 Cr elevated, last noted to be 1.6 4 years ago. Patient appears to be in acute renal failure.  [BS]  [id number] DG Chest Portable 1 View Given elevated BNP, will obtain CXR to evaluate for cardiomegaly. [BS]  [id number] Discussed with patient at length necessity of rectal exam to which he adamantly refuses. Reports that if he has to have a bowel movement he will give [place] a stool sample but otherwise he will not allow anyone to examine his rectal area or perform a rectal exam at this time [SG]  0954 Peroneal DVT on duplex, will hold AC given anemia [SG]  0955 Anemia, unclear source as pt will not consent to rectal exam, he denies melena or BRBPR, will consult GI [SG]  1001 Spoke  with answering service for Dr Dulce Sellar, awaiting callback.  [SG]  1001 Paged nephro on call Dr Arrie Aran. Reviewed results, agree w/ admission. Will see on consult on admission  [SG]  1032 Received message back from Dr Dulce Sellar, recommends we d/w [facility], Paged on call Dr Meridee Score [SG]  [phone] Spoke with Dr Meridee Score, will see in consult [SG]  1043 Troponin I (High Sensitivity)(!): 68 EKG stable, no cp or dib, favor demand ischemia in setting of renal failure, sig anemia. Hold off AC at this time given profound anemia  [SG]  1044 BP(!): 201/103 Given hydralazine IV with improvement. Pt reports he stopped taking his hydrochlorothiazide and norvasc for unknown reason  [SG]  1142 Spoke w/ Dr Rhona Leavens, pt accepted for admit [facility] [SG]    Clinical Course User Index [BS] Bess Kinds, MD [SG] Sloan Leiter, DO                             Medical Decision Making Patient noted to have elevated BNP, with normal chest x-ray, CT abdomen pelvis unremarkable for acute cause of renal failure/anemia.  DVT ultrasound showed distal DVT in peroneal vein.  Given patient is anemic with a hemoglobin of 6.5, will hold anticoagulation for now.    Blood pressure hypertensive to 200s over 100s.  Patient given one-time dose of hydralazine  Patient CMP showed elevated creatinine, concerning for acute renal failure.  Nephrology was consulted and will see patient while he is admitted.  Given anemia some concern for GI bleed, however patient refuses FOBT/DRE.  GI consulted and will follow inpatient.  Patient will likely need to be admitted for acute renal failure, with DVT, with significant anemia.  Picture concerning for malignancy given hypercoagulable state, anemia, acute renal failure.  Amount and/or Complexity of Data Reviewed Labs: ordered. Decision-making details documented in ED Course.    Details: CMP, TSH, BNP, CBC Radiology: ordered. Decision-making details documented in ED Course.    Details: CXR CT  Chest/Abd/Pelv WO ordered to screen for causes of elevated Cr  and low hgb.  Left DVT US to evaluate for DVT  Risk OTC drugs. Prescription drug management. Decision regarding hospitalization.           Final Clinical Impression(s) / ED Diagnoses Final diagnoses:  Acute renal failure, unspecified acute renal failure type (HCC)  Leg edema  Acute congestive heart failure, unspecified heart failure type (HCC)  Anemia, unspecified type  Hepatic steatosis  Acute deep vein thrombosis (DVT) of left peroneal vein (HCC)  Hypokalemia  Hypomagnesemia    Rx / DC Orders ED Discharge Orders     None         Bess Kinds, MD 04/13/23 1216    Tanda Rockers A, DO 04/15/23 [phone]

## 2023-04-13 NOTE — ED Notes (Signed)
Attempted to call report to [facility]. Advised to call back in 10 min

## 2023-04-13 NOTE — Progress Notes (Signed)
D/W IR attending Dr. Loreta Ave, IR saw some warning signs for prostate cancer on CT abd/pelvis, and recommend PSA level.  If PSA elevated, IR requests patient be seen by urology before IVC placement.

## 2023-04-13 NOTE — ED Notes (Signed)
Pt refused occult stool sample

## 2023-04-13 NOTE — ED Triage Notes (Signed)
Pt with 3 days of BLE swelling and pain. Pt states his podiatrist thinks he is diabetic with possible neuropathy and was going to send him to have imaging done on his legs due to pain. Pt here for continued pain and swelling. This is an entirely new issue for him; no hx of CHF or HTN (BP 196/98 in triage). Pt denies SOB; endorses weight loss for last 6 months. Does not take medications.

## 2023-04-13 NOTE — TOC CM/SW Note (Signed)
CM attempted to complete initial assessment. Patient was angry because he is on a full liquid diet - He asked me to come back another time.   Will return at a later date

## 2023-04-13 NOTE — H&P (Signed)
History and Physical    Timothy Chambers ZOX:[id number] DOB: [birth year] DOA: 04/13/2023  PCP: Dolan Amen, MD (Confirm with patient/family/NH records and if not entered, this has to be entered at [facility] point of entry) Patient coming from: Home  I have personally briefly reviewed patient's old medical records in [facility]  Chief Complaint: Left leg pain  HPI: Timothy Chambers is a 67 y.o. male with medical history significant of HTN, noncompliant with BP meds, presented with 4 days of left leg pain, poor oral intake.  Symptoms started suddenly 4 days ago with cramping-like left leg pain, from back to knee to heel, worsening with ambulation.  Unable to remain standing for long time or walk for short distance, patient remained bedbound for last 4 days unable to make any food or drink much of water and became very dehydrated.  He has been taking OTC pain medications occasionally for the leg pain with no significant improvement.  Yesterday, he started to notice maroon-colored stool but denies any abdominal pain.  He passed maroon-colored stool 2 times yesterday and today denies any chest pain lightheadedness or shortness of breath.  He used to be on hypertension medication but stopped taking several years ago denies any drinking or tobacco use recently.  No recent travels.  No history of coagulopathy or blood clot problems.  ED Course: Blood pressure significant elevated, temperature 98, lab work showed WBC 12, hemoglobin 6.3.  Creatinine 4.2 compared to baseline 1.6 several years ago BUN 57.  DVT tested positive before left peroneal vein DVT.  Patient with bordered PRBC.  Started on PPI drip, GI and nephrology consulted  Review of Systems: As per HPI otherwise 14 point review of systems negative.    Past Medical History:  Diagnosis Date   Alcohol abuse    Hypertension     Past Surgical History:  Procedure Laterality Date   BACK SURGERY       reports that he has been smoking  cigarettes. He has been smoking an average of .5 packs per day. He has never used smokeless tobacco. He reports that he does not drink alcohol and does not use drugs.  Allergies  Allergen Reactions   Hydrocodone-Acetaminophen     itch    Family History  Problem Relation Age of Onset   Cancer Mother    Heart disease Mother    Hypertension Father    Heart disease Father    Hypertension Sister      Prior to Admission medications   Medication Sig Start Date End Date Taking? Authorizing Provider  amLODipine (NORVASC) 5 MG tablet Take 1 tablet (5 mg total) by mouth daily. 06/28/19   Sharlene Dory, DO  ciprofloxacin-dexamethasone (CIPRODEX) OTIC suspension Place 4 drops into the right ear 2 (two) times daily. 03/09/20   Arby Barrette, MD  hydrochlorothiazide (HYDRODIURIL) 25 MG tablet Take 1 tablet (25 mg total) by mouth daily. 06/28/19   Sharlene Dory, DO  sildenafil (VIAGRA) 100 MG tablet Take 0.5-1 tablets (50-100 mg total) by mouth daily as needed for erectile dysfunction. 06/28/19   Sharlene Dory, DO    Physical Exam: Vitals:   04/13/23 1145 04/13/23 1200 04/13/23 1230 04/13/23 1358  BP: (!) 156/76 (!) 155/77 (!) 154/73 (!) 174/82  Pulse: 90 89 89 (!) 107  Resp: 14 10 12 15   Temp:  98.5 F (36.9 C)  98.6 F (37 C)  TempSrc:    Oral  SpO2: 100% 100% 100% 100%  Weight:  Height:        Constitutional: NAD, calm, comfortable Vitals:   04/13/23 1145 04/13/23 1200 04/13/23 1230 04/13/23 1358  BP: (!) 156/76 (!) 155/77 (!) 154/73 (!) 174/82  Pulse: 90 89 89 (!) 107  Resp: 14 10 12 15   Temp:  98.5 F (36.9 C)  98.6 F (37 C)  TempSrc:    Oral  SpO2: 100% 100% 100% 100%  Weight:      Height:       Eyes: PERRL, lids and conjunctivae normal ENMT: Mucous membranes are moist. Posterior pharynx clear of any exudate or lesions.Normal dentition.  Neck: normal, supple, no masses, no thyromegaly Respiratory: clear to auscultation bilaterally, no  wheezing, no crackles. Normal respiratory effort. No accessory muscle use.  Cardiovascular: Regular rate and rhythm, no murmurs / rubs / gallops. No extremity edema. 2+ pedal pulses. No carotid bruits.  Abdomen: no tenderness, no masses palpated. No hepatosplenomegaly. Bowel sounds positive.  Musculoskeletal: no clubbing / cyanosis. No joint deformity upper and lower extremities. Good ROM, no contractures. Normal muscle tone.  Left calf swelling and severe tenderness Skin: no rashes, lesions, ulcers. No induration Neurologic: CN 2-12 grossly intact. Sensation intact, DTR normal. Strength 5/5 in all 4.  Psychiatric: Normal judgment and insight. Alert and oriented x 3. Normal mood.    Labs on Admission: I have personally reviewed following labs and imaging studies  CBC: Recent Labs  Lab 04/13/23 0748 04/13/23 0810  WBC 12.1* 14.0*  NEUTROABS 10.0* 11.4*  HGB 6.3* 6.5*  HCT 18.9* 19.3*  MCV 94.5 95.5  PLT 531* 526*   Basic Metabolic Panel: Recent Labs  Lab 04/13/23 0748  NA 140  K 2.6*  CL 111  CO2 16*  GLUCOSE 103*  BUN 57*  CREATININE 4.28*  CALCIUM 7.6*  MG 1.0*   GFR: Estimated Creatinine Clearance: 14.4 mL/min (A) (by C-G formula based on SCr of 4.28 mg/dL (H)). Liver Function Tests: Recent Labs  Lab 04/13/23 0748  AST 34  ALT 23  ALKPHOS 125  BILITOT 0.6  PROT 6.4*  ALBUMIN 2.6*   No results for input(s): "LIPASE", "AMYLASE" in the last 168 hours. No results for input(s): "AMMONIA" in the last 168 hours. Coagulation Profile: Recent Labs  Lab 04/13/23 0750  INR 1.0   Cardiac Enzymes: No results for input(s): "CKTOTAL", "CKMB", "CKMBINDEX", "TROPONINI" in the last 168 hours. BNP (last 3 results) No results for input(s): "PROBNP" in the last 8760 hours. HbA1C: No results for input(s): "HGBA1C" in the last 72 hours. CBG: No results for input(s): "GLUCAP" in the last 168 hours. Lipid Profile: No results for input(s): "CHOL", "HDL", "LDLCALC", "TRIG",  "CHOLHDL", "LDLDIRECT" in the last 72 hours. Thyroid Function Tests: Recent Labs    04/13/23 0750  TSH 1.621  FREET4 0.96   Anemia Panel: Recent Labs    04/13/23 0809  VITAMINB12 454  FOLATE 4.4*  FERRITIN 327  TIBC 204*  IRON 22*  RETICCTPCT 2.3   Urine analysis:    Component Value Date/Time   COLORURINE YELLOW 04/13/2023 0818   APPEARANCEUR CLEAR 04/13/2023 0818   LABSPEC 1.015 04/13/2023 0818   PHURINE 5.5 04/13/2023 0818   GLUCOSEU NEGATIVE 04/13/2023 0818   HGBUR SMALL (A) 04/13/2023 0818   BILIRUBINUR NEGATIVE 04/13/2023 0818   KETONESUR NEGATIVE 04/13/2023 0818   PROTEINUR 100 (A) 04/13/2023 0818   UROBILINOGEN 0.2 03/10/2013 1153   NITRITE NEGATIVE 04/13/2023 0818   LEUKOCYTESUR NEGATIVE 04/13/2023 0818    Radiological Exams on Admission: CT ABDOMEN PELVIS  WO CONTRAST  Result Date: 04/13/2023 CLINICAL DATA:  Acute renal failure, bilateral lower extremity swelling and pain EXAM: CT ABDOMEN AND PELVIS WITHOUT CONTRAST TECHNIQUE: Multidetector CT imaging of the abdomen and pelvis was performed following the standard protocol without IV contrast. RADIATION DOSE REDUCTION: This exam was performed according to the departmental dose-optimization program which includes automated exposure control, adjustment of the mA and/or kV according to patient size and/or use of iterative reconstruction technique. COMPARISON:  None Available. FINDINGS: Lower chest: No acute abnormality. Hepatobiliary: No solid liver abnormality is seen. Hepatic steatosis. No gallstones, gallbladder wall thickening, or biliary dilatation. Pancreas: Unremarkable. No pancreatic ductal dilatation or surrounding inflammatory changes. Spleen: Normal in size without significant abnormality. Adrenals/Urinary Tract: Adrenal glands are unremarkable. Kidneys are normal, without renal calculi, solid lesion, or hydronephrosis. Bladder is unremarkable. Stomach/Bowel: Stomach is within normal limits. Appendix appears  normal. No evidence of bowel wall thickening, distention, or inflammatory changes. Sigmoid diverticulosis. Vascular/Lymphatic: Aortic atherosclerosis. No enlarged abdominal or pelvic lymph nodes. Reproductive: No mass or other significant abnormality. Other: No abdominal wall hernia or abnormality. No ascites. Musculoskeletal: No acute or significant osseous findings. IMPRESSION: 1. No acute noncontrast CT findings of the abdomen or pelvis to explain acute renal failure. No urinary tract calculi or hydronephrosis. 2. Hepatic steatosis. 3. Sigmoid diverticulosis without evidence of acute diverticulitis. Aortic Atherosclerosis (ICD10-I70.0). Electronically Signed   By: Jearld Lesch M.D.   On: 04/13/2023 09:47   US Venous Img Lower Unilateral Left  Result Date: 04/13/2023 CLINICAL DATA:  Bilateral foot pain Left lower extremity edema EXAM: LEFT LOWER EXTREMITY VENOUS DOPPLER ULTRASOUND TECHNIQUE: Gray-scale sonography with compression, as well as color and duplex ultrasound, were performed to evaluate the deep venous system(s) from the level of the common femoral vein through the popliteal and proximal calf veins. COMPARISON:  None Available. FINDINGS: VENOUS Short segment thrombosis of the peroneal vein in the mid calf which is expanded and noncompressible with no color flow. Normal compressibility of the common femoral, superficial femoral, and popliteal veins. Visualized portions of profunda femoral vein and great saphenous vein unremarkable. No additional filling defects to suggest DVT on grayscale or color Doppler imaging. Doppler waveforms show otherwise normal direction of venous flow, normal respiratory plasticity and response to augmentation. Limited views of the contralateral common femoral vein are unremarkable. OTHER None. Limitations: none IMPRESSION: Short-segment acute thrombosis of the left peroneal vein in the mid calf. No additional left lower extremity DVT. Electronically Signed   By: Acquanetta Belling M.D.   On: 04/13/2023 09:46   DG Chest Portable 1 View  Result Date: 04/13/2023 CLINICAL DATA:  Three day history of bilateral lower extremity swelling and pain EXAM: PORTABLE CHEST 1 VIEW COMPARISON:  Chest radiograph dated 08/06/2018 FINDINGS: Normal lung volumes. No focal consolidations. No pleural effusion or pneumothorax. The heart size and mediastinal contours are within normal limits. No acute osseous abnormality. IMPRESSION: No active disease. Electronically Signed   By: Agustin Cree M.D.   On: 04/13/2023 09:44    EKG: Independently reviewed.  Sinus, LVH, no acute ST changes.  Assessment/Plan Principal Problem:   ARF (acute renal failure) (HCC) Active Problems:   HTN (hypertension)   DVT (deep venous thrombosis) (HCC)   Lower GI bleed   GI bleed  (please populate well all problems here in Problem List. (For example, if patient is on BP meds at home and you resume or decide to hold them, it is a problem that needs to be her. Same for CAD,  COPD, HLD and so on)  Acute left peroneal vein DVT -Given there is a active GI bleeding going on, and acute anemia, systemic anticoagulation contraindicated.  Ordered IR guided IVC filter. -No chest pain or shortness of breath, low suspicion for PE at this point.  Hold off VQ scan -Outpatient hematology follow-up for hypercoagulable state workup  Acute blood loss anemia, iron deficiency -Previous history of diverticulosis on pelvis CT abdominal scan, clinically suspect diverticulosis bleeding. -Clinically appears to be volume contracted but blood pressure stable, getting PRBC x 2 ordered by ED.  Recheck H&H tonight and tomorrow morning and continue transfusion if indicated. -GI consulted in ED and ordered PPI drip  AKI -Likely prerenal secondary to poor oral intake, may have some element of NSAIDs nephropathy -Consultation appreciated and agreed with further workup for other etiology -IV hydration  HTN, poorly controlled -Secondary to  noncompliant with BP meds -Resume amlodipine, add as needed hydralazine  Severe hypokalemia -IV and p.o. replacement  Severe hypomagnesemia -IV replacement, recheck level tomorrow    DVT prophylaxis: None Code Status: Full code Family Communication: None at bedside Disposition Plan: Sick with multiple conditions including GI bleed DVT, acute blood loss anemia requiring transfusion and inpatient GI consult, expect more than 2 midnight hospital stay Consults called: GI and nephro Admission status: PCU admit   Emeline General MD [facility] Pager [phone]  04/13/2023, 3:17 PM

## 2023-04-14 ENCOUNTER — Inpatient Hospital Stay (HOSPITAL_COMMUNITY): Payer: Medicare HMO

## 2023-04-14 DIAGNOSIS — I509 Heart failure, unspecified: Secondary | ICD-10-CM | POA: Diagnosis not present

## 2023-04-14 DIAGNOSIS — E876 Hypokalemia: Secondary | ICD-10-CM | POA: Diagnosis not present

## 2023-04-14 DIAGNOSIS — I82402 Acute embolism and thrombosis of unspecified deep veins of left lower extremity: Secondary | ICD-10-CM | POA: Diagnosis not present

## 2023-04-14 DIAGNOSIS — I82452 Acute embolism and thrombosis of left peroneal vein: Secondary | ICD-10-CM | POA: Diagnosis not present

## 2023-04-14 DIAGNOSIS — K922 Gastrointestinal hemorrhage, unspecified: Secondary | ICD-10-CM | POA: Diagnosis not present

## 2023-04-14 DIAGNOSIS — K2990 Gastroduodenitis, unspecified, without bleeding: Secondary | ICD-10-CM | POA: Diagnosis not present

## 2023-04-14 DIAGNOSIS — I10 Essential (primary) hypertension: Secondary | ICD-10-CM | POA: Diagnosis not present

## 2023-04-14 DIAGNOSIS — R600 Localized edema: Secondary | ICD-10-CM | POA: Diagnosis not present

## 2023-04-14 DIAGNOSIS — K209 Esophagitis, unspecified without bleeding: Secondary | ICD-10-CM | POA: Diagnosis not present

## 2023-04-14 DIAGNOSIS — D509 Iron deficiency anemia, unspecified: Secondary | ICD-10-CM | POA: Diagnosis not present

## 2023-04-14 DIAGNOSIS — K921 Melena: Secondary | ICD-10-CM | POA: Diagnosis not present

## 2023-04-14 DIAGNOSIS — D125 Benign neoplasm of sigmoid colon: Secondary | ICD-10-CM | POA: Diagnosis not present

## 2023-04-14 DIAGNOSIS — K6389 Other specified diseases of intestine: Secondary | ICD-10-CM | POA: Diagnosis not present

## 2023-04-14 DIAGNOSIS — D124 Benign neoplasm of descending colon: Secondary | ICD-10-CM | POA: Diagnosis not present

## 2023-04-14 DIAGNOSIS — N179 Acute kidney failure, unspecified: Secondary | ICD-10-CM | POA: Diagnosis not present

## 2023-04-14 HISTORY — PX: IR IVC FILTER PLMT / S&I /IMG GUID/MOD SED: IMG701

## 2023-04-14 HISTORY — PX: IR INTRAVASCULAR ULTRASOUND NON CORONARY: IMG6085

## 2023-04-14 LAB — BASIC METABOLIC PANEL
Anion gap: 9 (ref 5–15)
BUN: 44 mg/dL — ABNORMAL HIGH (ref 8–23)
CO2: 13 mmol/L — ABNORMAL LOW (ref 22–32)
Calcium: 7.2 mg/dL — ABNORMAL LOW (ref 8.9–10.3)
Chloride: 118 mmol/L — ABNORMAL HIGH (ref 98–111)
Creatinine, Ser: 3.9 mg/dL — ABNORMAL HIGH (ref 0.61–1.24)
GFR, Estimated: 16 mL/min — ABNORMAL LOW (ref >60)
Glucose, Bld: 98 mg/dL (ref 70–99)
Potassium: 3.4 mmol/L — ABNORMAL LOW (ref 3.5–5.1)
Sodium: 140 mmol/L (ref 135–145)

## 2023-04-14 LAB — ECHOCARDIOGRAM COMPLETE
AR max vel: 2.62 cm2
AV Area VTI: 2.52 cm2
AV Area mean vel: 2.69 cm2
AV Mean grad: 4 mmHg
AV Peak grad: 7 mmHg
Ao pk vel: 1.32 m/s
Area-P 1/2: 3.72 cm2
Height: 65 in
MV M vel: 5.73 m/s
MV Peak grad: 131.3 mmHg
Radius: 0.8 cm
S' Lateral: 3.2 cm
Weight: 2080 oz

## 2023-04-14 LAB — EXTRACTABLE NUCLEAR ANTIGEN ANTIBODY
ENA SM Ab Ser-aCnc: <0.2 AI (ref 0.0–0.9)
Ribonucleic Protein: 0.2 AI (ref 0.0–0.9)
SSA (Ro) (ENA) Antibody, IgG: <0.2 AI (ref 0.0–0.9)
SSB (La) (ENA) Antibody, IgG: <0.2 AI (ref 0.0–0.9)
Scleroderma (Scl-70) (ENA) Antibody, IgG: <0.2 AI (ref 0.0–0.9)
ds DNA Ab: <1 IU/mL (ref 0–9)

## 2023-04-14 LAB — TYPE AND SCREEN
Unit division: 0
Unit division: 0

## 2023-04-14 LAB — ANA W/REFLEX IF POSITIVE: Anti Nuclear Antibody (ANA): NEGATIVE

## 2023-04-14 LAB — CBC WITH DIFFERENTIAL/PLATELET
Basophils Absolute: 0.1 10*3/uL (ref 0.0–0.1)
Eosinophils Absolute: 0.1 10*3/uL (ref 0.0–0.5)
Eosinophils Relative: 1 %
HCT: 19.3 % — ABNORMAL LOW (ref 39.0–52.0)
Hemoglobin: 6.5 g/dL — CL (ref 13.0–17.0)
Lymphocytes Relative: 10 %
Lymphs Abs: 1.3 10*3/uL (ref 0.7–4.0)
MCHC: 33.7 g/dL (ref 30.0–36.0)
MCV: 95.5 fL (ref 80.0–100.0)
Monocytes Absolute: 0.9 10*3/uL (ref 0.1–1.0)
Monocytes Relative: 7 %
Neutro Abs: 11.4 10*3/uL — ABNORMAL HIGH (ref 1.7–7.7)
Neutrophils Relative %: 81 %
Platelets: 526 10*3/uL — ABNORMAL HIGH (ref 150–400)
RDW: 14.6 % (ref 11.5–15.5)
WBC: 14 10*3/uL — ABNORMAL HIGH (ref 4.0–10.5)
nRBC: 0 % (ref 0.0–0.2)

## 2023-04-14 LAB — CBC
HCT: 17.2 % — ABNORMAL LOW (ref 39.0–52.0)
Hemoglobin: 5.5 g/dL — CL (ref 13.0–17.0)
MCH: 31.1 pg (ref 26.0–34.0)
MCHC: 32 g/dL (ref 30.0–36.0)
MCV: 97.2 fL (ref 80.0–100.0)
Platelets: 436 10*3/uL — ABNORMAL HIGH (ref 150–400)
RBC: 1.77 MIL/uL — ABNORMAL LOW (ref 4.22–5.81)
RDW: 15 % (ref 11.5–15.5)
WBC: 12.5 10*3/uL — ABNORMAL HIGH (ref 4.0–10.5)
nRBC: 0 % (ref 0.0–0.2)

## 2023-04-14 LAB — PREPARE RBC (CROSSMATCH)

## 2023-04-14 LAB — ANTI-JO 1 ANTIBODY, IGG: Anti JO-1: <0.2 AI (ref 0.0–0.9)

## 2023-04-14 LAB — ANCA TITERS
Atypical P-ANCA titer: <1:20 {titer}
C-ANCA: <1:20 {titer}
P-ANCA: <1:20 {titer}

## 2023-04-14 LAB — BPAM RBC
Blood Product Expiration Date: 202408022359
Unit Type and Rh: 5100

## 2023-04-14 LAB — ANTISTREPTOLYSIN O TITER: ASO: <20 IU/mL (ref 0.0–200.0)

## 2023-04-14 LAB — C3 COMPLEMENT: C3 Complement: 167 mg/dL (ref 82–167)

## 2023-04-14 LAB — HEMOGLOBIN AND HEMATOCRIT, BLOOD
HCT: 25.9 % — ABNORMAL LOW (ref 39.0–52.0)
Hemoglobin: 8.5 g/dL — ABNORMAL LOW (ref 13.0–17.0)

## 2023-04-14 LAB — ABO/RH: ABO/RH(D): O POS

## 2023-04-14 LAB — C4 COMPLEMENT: Complement C4, Body Fluid: 42 mg/dL — ABNORMAL HIGH (ref 12–38)

## 2023-04-14 LAB — MAGNESIUM: Magnesium: 1.3 mg/dL — ABNORMAL LOW (ref 1.7–2.4)

## 2023-04-14 LAB — PSA: Prostatic Specific Antigen: 1.09 ng/mL (ref 0.00–4.00)

## 2023-04-14 MED ORDER — LIDOCAINE HCL 1 % IJ SOLN
20.0000 mL | Freq: Once | INTRAMUSCULAR | Status: DC
  Filled 2023-04-14: qty 20

## 2023-04-14 MED ORDER — MAGNESIUM SULFATE 4 GM/100ML IV SOLN
4.0000 g | Freq: Once | INTRAVENOUS | Status: AC
  Administered 2023-04-14: 4 g via INTRAVENOUS
  Filled 2023-04-14: qty 100

## 2023-04-14 MED ORDER — SODIUM CHLORIDE 0.9 % IV SOLN: INTRAVENOUS | Status: DC

## 2023-04-14 MED ORDER — ACETAMINOPHEN 325 MG PO TABS
650.0000 mg | ORAL_TABLET | Freq: Once | ORAL | Status: AC
  Administered 2023-04-14: 650 mg via ORAL
  Filled 2023-04-14: qty 2

## 2023-04-14 MED ORDER — SODIUM CHLORIDE 0.9% IV SOLUTION: Freq: Once | INTRAVENOUS | Status: AC

## 2023-04-14 MED ORDER — POTASSIUM CHLORIDE CRYS ER 20 MEQ PO TBCR
40.0000 meq | EXTENDED_RELEASE_TABLET | Freq: Once | ORAL | Status: AC
  Administered 2023-04-14: 40 meq via ORAL
  Filled 2023-04-14: qty 2

## 2023-04-14 MED ORDER — LIDOCAINE HCL 1 % IJ SOLN
INTRAMUSCULAR | Status: AC
  Filled 2023-04-14: qty 20

## 2023-04-14 MED ORDER — DIPHENHYDRAMINE HCL 25 MG PO CAPS
25.0000 mg | ORAL_CAPSULE | Freq: Once | ORAL | Status: AC
  Administered 2023-04-14: 25 mg via ORAL
  Filled 2023-04-14: qty 1

## 2023-04-14 NOTE — Consult Note (Addendum)
Consultation Note   Referring Provider:  [facility] PCP: Dolan Amen, MD Primary Gastroenterologist: Gentry Fitz        Reason for consultation: GI bleed  DOA: 04/13/2023         Hospital Day: 2   Assessment and Plan    67 yo male with a past medical history not necessarily limited to HTN, CKD stage III , diverticulosis, and hepatic steatosis. Admitted with maroon stools ( in ED x2 ) ,  profound [place] anemia, reported 50 pound weight loss over last 6 months 2/2 diminished appetite. Hgb 6.3, last hgb 14.7 in 2019. Rule out GI malignancy, especially given new LLE  -No malignancies seen on CT AP but was without contrast -Will check iron studies, b12, folate if not already ordered -Needs EGD and colonoscopy. However, he refuses to have this done tomorrow because he doesn't want to be on clear liquids today. The risks and benefits of EGD and colonoscopy with possible polypectomy / biopsies were discussed and the patient agrees to proceed.  -Will prep tomorrow for EGD / colonoscopy on Friday if Endoscopy time is available and hgb > 7 . Blood transfusion starting now.  -Continue PPI infusion   LLE DVT.  Not candidate for anticoagulation right now 2/2 to GI bleed. IR placed IVC filter   AKI on CKD. Cr 4.28 ( no recent labs to compare but was 1.66 in 2019) Felt to be pre-renal 2/2/ to poor PO intake.   Hypomagnesemia / hypokalemia -Repletion in progress   History of Present Illness  Patient admitted with AKI on CKD 3, maroon stools ( while in ED), profound anemia. Hgb 6.3.  He adamantly denies GI blood loss but in ED has a large amount of loose maroon stool. Initially refused blood transfusion but then acquiesced. He denies NSAID use. No abdominal pain. No N/V. He reports a 50  pound weight loss over the last several months due to poor appetite.   Normal TSH. LFTs normal except low albumin ( probably nutritional)   Labs and  Imaging: Recent Labs    04/13/23 0748 04/13/23 0810 04/14/23 0732  WBC 12.1* 14.0* 12.5*  HGB 6.3* 6.5* 5.5*  HCT 18.9* 19.3* 17.2*  PLT 531* 526* 436*   Recent Labs    04/13/23 0748 04/14/23 0732  NA 140 140  K 2.6* 3.4*  CL 111 118*  CO2 16* 13*  GLUCOSE 103* 98  BUN 57* 44*  CREATININE 4.28* 3.90*  CALCIUM 7.6* 7.2*   Recent Labs    04/13/23 0748  PROT 6.4*  ALBUMIN 2.6*  AST 34  ALT 23  ALKPHOS 125  BILITOT 0.6   No results for input(s): "HEPBSAG", "HCVAB", "HEPAIGM", "HEPBIGM" in the last 72 hours. Recent Labs    04/13/23 0750  LABPROT 13.2  INR 1.0   CT AP wo contrast 04/13/23 1. No acute noncontrast CT findings of the abdomen or pelvis to explain acute renal failure. No urinary tract calculi or hydronephrosis. 2. Hepatic steatosis. 3. Sigmoid diverticulosis without evidence of acute diverticulitis    Previous GI Evaluation:  None found but reports colonoscopy in [place] 7-10 years ago   Principal Problem:   ARF (acute renal failure) (HCC) Active Problems:   HTN (hypertension)  DVT (deep venous thrombosis) (HCC)   Lower GI bleed   GI bleed     Past Medical History:  Diagnosis Date   Alcohol abuse    Hypertension     Past Surgical History:  Procedure Laterality Date   BACK SURGERY      Family History  Problem Relation Age of Onset   Cancer Mother    Heart disease Mother    Hypertension Father    Heart disease Father    Hypertension Sister     Prior to Admission medications   Medication Sig Start Date End Date Taking? Authorizing Provider  amLODipine (NORVASC) 5 MG tablet Take 1 tablet (5 mg total) by mouth daily. 06/28/19   Sharlene Dory, DO  ciprofloxacin-dexamethasone (CIPRODEX) OTIC suspension Place 4 drops into the right ear 2 (two) times daily. 03/09/20   Arby Barrette, MD  hydrochlorothiazide (HYDRODIURIL) 25 MG tablet Take 1 tablet (25 mg total) by mouth daily. 06/28/19   Sharlene Dory, DO   sildenafil (VIAGRA) 100 MG tablet Take 0.5-1 tablets (50-100 mg total) by mouth daily as needed for erectile dysfunction. 06/28/19   Sharlene Dory, DO    Current Facility-Administered Medications  Medication Dose Route Frequency Provider Last Rate Last Admin   0.9 %  sodium chloride infusion (Manually program via Guardrails IV Fluids)   Intravenous Once Mikey College T, MD       0.9 %  sodium chloride infusion (Manually program via Guardrails IV Fluids)   Intravenous Once Maretta Bees, MD       0.9 %  sodium chloride infusion   Intravenous Continuous Emeline General, MD 125 mL/hr at 04/14/23 0039 Rate Change at 04/14/23 0039   acetaminophen (TYLENOL) tablet 650 mg  650 mg Oral Q6H PRN Emeline General, MD   650 mg at 04/13/23 2251   Or   acetaminophen (TYLENOL) suppository 650 mg  650 mg Rectal Q6H PRN Emeline General, MD       amLODipine (NORVASC) tablet 5 mg  5 mg Oral Daily Mikey College T, MD   5 mg at 04/14/23 1006   feeding supplement (ENSURE ENLIVE / ENSURE PLUS) liquid 237 mL  237 mL Oral BID BM Mikey College T, MD       hydrALAZINE (APRESOLINE) injection 5 mg  5 mg Intravenous Q6H PRN Mikey College T, MD       HYDROmorphone (DILAUDID) injection 0.5 mg  0.5 mg Intravenous Q4H PRN Mikey College T, MD   0.5 mg at 04/14/23 1610   lidocaine (XYLOCAINE) 1 % (with pres) injection 20 mL  20 mL Intradermal Once Roanna Banning, MD       lidocaine (XYLOCAINE) 1 % (with pres) injection 20 mL  20 mL Infiltration Once Roanna Banning, MD       magnesium sulfate IVPB 4 g 100 mL  4 g Intravenous Once Maretta Bees, MD       Oral care mouth rinse  15 mL Mouth Rinse PRN Emeline General, MD       [START ON 04/16/2023] pantoprazole (PROTONIX) injection 40 mg  40 mg Intravenous Q12H Tanda Rockers A, DO       pantoprozole (PROTONIX) 80 mg /NS 100 mL infusion  8 mg/hr Intravenous Continuous Tanda Rockers A, DO 10 mL/hr at 04/14/23 0629 8 mg/hr at 04/14/23 0629    Allergies as of 04/13/2023 - Review Complete  04/13/2023  Allergen Reaction Noted   Hydrocodone-acetaminophen  12/29/2006    Social  History   Socioeconomic History   Marital status: Divorced    Spouse name: Not on file   Number of children: Not on file   Years of education: Not on file   Highest education level: Not on file  Occupational History   Not on file  Tobacco Use   Smoking status: Some Days    Packs/day: .5    Types: Cigarettes   Smokeless tobacco: Never  Vaping Use   Vaping Use: Never used  Substance and Sexual Activity   Alcohol use: Never    Comment: occ   Drug use: No   Sexual activity: Never  Other Topics Concern   Not on file  Social History Narrative   Not on file   Social Determinants of Health   Financial Resource Strain: Not on file  Food Insecurity: No Food Insecurity (04/13/2023)   Hunger Vital Sign    Worried About Running Out of Food in the Last Year: Never true    Ran Out of Food in the Last Year: Never true  Transportation Needs: No Transportation Needs (04/13/2023)   PRAPARE - Administrator, Civil Service (Medical): No    Lack of Transportation (Non-Medical): No  Physical Activity: Not on file  Stress: Not on file  Social Connections: Not on file  Intimate Partner Violence: Not At Risk (04/13/2023)   Humiliation, Afraid, Rape, and Kick questionnaire    Fear of Current or Ex-Partner: No    Emotionally Abused: No    Physically Abused: No    Sexually Abused: No     Code Status   Code Status: Full Code  Review of Systems: All systems reviewed and negative except where noted in HPI.  Physical Exam: Vital signs in last 24 hours: Temp:  [98.2 F (36.8 C)-98.7 F (37.1 C)] 98.7 F (37.1 C) (07/03 1001) Pulse Rate:  [86-107] 93 (07/03 1001) Resp:  [10-17] 16 (07/03 1001) BP: (125-174)/(65-92) 160/79 (07/03 1001) SpO2:  [99 %-100 %] 100 % (07/03 1001) Last BM Date : 04/13/23  General:  Pleasant thin male in NAD Psych:  Cooperative. Normal mood and affect Eyes:  Pupils equal Ears:  Normal auditory acuity Nose: No deformity, discharge or lesions Neck:  Supple, no masses felt Lungs:  Clear to auscultation.  Heart:  Regular rate, regular rhythm.  Abdomen:  Soft, nondistended, nontender, active bowel sounds, no masses felt Rectal :  Deferred Msk: Symmetrical without gross deformities.  Neurologic:  Alert, oriented, grossly normal neurologically Extremities : 1 + bilateral lower extremity edema Skin:  Intact without significant lesions.    Intake/Output from previous day: 07/02 0701 - 07/03 0700 In: 1767.3 [I.V.:1118; IV Piggyback:649.3] Out: 200 [Urine:200] Intake/Output this shift:  No intake/output data recorded.     Willette Cluster, NP-C @  04/14/2023, 10:32 AM

## 2023-04-14 NOTE — Progress Notes (Signed)
Patient ID: Timothy Chambers, male   DOB: 06-11-1956, 67 y.o.   MRN: [id number] S: Feeling better this morning.  Had IVC filter placed by IR this morning. O:BP (!) 173/91   Pulse (!) 104   Temp 98.7 F (37.1 C) (Axillary)   Resp 15   Ht 5\' 5"  (1.651 m)   Wt 59 kg   SpO2 97%   BMI 21.63 kg/m   Intake/Output Summary (Last 24 hours) at 04/14/2023 1105 Last data filed at 04/14/2023 7829 Gross per 24 hour  Intake 1118.02 ml  Output 200 ml  Net 918.02 ml   Intake/Output: I/O last 3 completed shifts: In: 1767.3 [I.V.:1118; IV Piggyback:649.3] Out: 200 [Urine:200]  Intake/Output this shift:  No intake/output data recorded. Weight change:  Gen: NAD CVS: tachy Resp: CTA Abd: +BS, soft, NT/ND Ext: trace pedal edema.  Recent Labs  Lab 04/13/23 0748 04/14/23 0732  NA 140 140  K 2.6* 3.4*  CL 111 118*  CO2 16* 13*  GLUCOSE 103* 98  BUN 57* 44*  CREATININE 4.28* 3.90*  ALBUMIN 2.6*  --   CALCIUM 7.6* 7.2*  AST 34  --   ALT 23  --    Liver Function Tests: Recent Labs  Lab 04/13/23 0748  AST 34  ALT 23  ALKPHOS 125  BILITOT 0.6  PROT 6.4*  ALBUMIN 2.6*   No results for input(s): "LIPASE", "AMYLASE" in the last 168 hours. No results for input(s): "AMMONIA" in the last 168 hours. CBC: Recent Labs  Lab 04/13/23 0748 04/13/23 0810 04/14/23 0732  WBC 12.1* 14.0* 12.5*  NEUTROABS 10.0* 11.4*  --   HGB 6.3* 6.5* 5.5*  HCT 18.9* 19.3* 17.2*  MCV 94.5 95.5 97.2  PLT 531* 526* 436*   Cardiac Enzymes: No results for input(s): "CKTOTAL", "CKMB", "CKMBINDEX", "TROPONINI" in the last 168 hours. CBG: No results for input(s): "GLUCAP" in the last 168 hours.  Iron Studies:  Recent Labs    04/13/23 0809  IRON 22*  TIBC 204*  FERRITIN 327   Studies/Results: CT ABDOMEN PELVIS WO CONTRAST  Result Date: 04/13/2023 CLINICAL DATA:  Acute renal failure, bilateral lower extremity swelling and pain EXAM: CT ABDOMEN AND PELVIS WITHOUT CONTRAST TECHNIQUE: Multidetector CT imaging  of the abdomen and pelvis was performed following the standard protocol without IV contrast. RADIATION DOSE REDUCTION: This exam was performed according to the departmental dose-optimization program which includes automated exposure control, adjustment of the mA and/or kV according to patient size and/or use of iterative reconstruction technique. COMPARISON:  None Available. FINDINGS: Lower chest: No acute abnormality. Hepatobiliary: No solid liver abnormality is seen. Hepatic steatosis. No gallstones, gallbladder wall thickening, or biliary dilatation. Pancreas: Unremarkable. No pancreatic ductal dilatation or surrounding inflammatory changes. Spleen: Normal in size without significant abnormality. Adrenals/Urinary Tract: Adrenal glands are unremarkable. Kidneys are normal, without renal calculi, solid lesion, or hydronephrosis. Bladder is unremarkable. Stomach/Bowel: Stomach is within normal limits. Appendix appears normal. No evidence of bowel wall thickening, distention, or inflammatory changes. Sigmoid diverticulosis. Vascular/Lymphatic: Aortic atherosclerosis. No enlarged abdominal or pelvic lymph nodes. Reproductive: No mass or other significant abnormality. Other: No abdominal wall hernia or abnormality. No ascites. Musculoskeletal: No acute or significant osseous findings. IMPRESSION: 1. No acute noncontrast CT findings of the abdomen or pelvis to explain acute renal failure. No urinary tract calculi or hydronephrosis. 2. Hepatic steatosis. 3. Sigmoid diverticulosis without evidence of acute diverticulitis. Aortic Atherosclerosis (ICD10-I70.0). Electronically Signed   By: Jearld Lesch M.D.   On: 04/13/2023 09:47  US Venous Img Lower Unilateral Left  Result Date: 04/13/2023 CLINICAL DATA:  Bilateral foot pain Left lower extremity edema EXAM: LEFT LOWER EXTREMITY VENOUS DOPPLER ULTRASOUND TECHNIQUE: Gray-scale sonography with compression, as well as color and duplex ultrasound, were performed to evaluate  the deep venous system(s) from the level of the common femoral vein through the popliteal and proximal calf veins. COMPARISON:  None Available. FINDINGS: VENOUS Short segment thrombosis of the peroneal vein in the mid calf which is expanded and noncompressible with no color flow. Normal compressibility of the common femoral, superficial femoral, and popliteal veins. Visualized portions of profunda femoral vein and great saphenous vein unremarkable. No additional filling defects to suggest DVT on grayscale or color Doppler imaging. Doppler waveforms show otherwise normal direction of venous flow, normal respiratory plasticity and response to augmentation. Limited views of the contralateral common femoral vein are unremarkable. OTHER None. Limitations: none IMPRESSION: Short-segment acute thrombosis of the left peroneal vein in the mid calf. No additional left lower extremity DVT. Electronically Signed   By: Acquanetta Belling M.D.   On: 04/13/2023 09:46   DG Chest Portable 1 View  Result Date: 04/13/2023 CLINICAL DATA:  Three day history of bilateral lower extremity swelling and pain EXAM: PORTABLE CHEST 1 VIEW COMPARISON:  Chest radiograph dated 08/06/2018 FINDINGS: Normal lung volumes. No focal consolidations. No pleural effusion or pneumothorax. The heart size and mediastinal contours are within normal limits. No acute osseous abnormality. IMPRESSION: No active disease. Electronically Signed   By: Agustin Cree M.D.   On: 04/13/2023 09:44    sodium chloride   Intravenous Once   sodium chloride   Intravenous Once   amLODipine  5 mg Oral Daily   feeding supplement  237 mL Oral BID BM   lidocaine  20 mL Intradermal Once   lidocaine  20 mL Infiltration Once   [START ON 04/16/2023] pantoprazole  40 mg Intravenous Q12H    BMET    Component Value Date/Time   NA 140 04/14/2023 0732   K 3.4 (L) 04/14/2023 0732   CL 118 (H) 04/14/2023 0732   CO2 13 (L) 04/14/2023 0732   GLUCOSE 98 04/14/2023 0732   BUN 44 (H)  04/14/2023 0732   CREATININE 3.90 (H) 04/14/2023 0732   CALCIUM 7.2 (L) 04/14/2023 0732   GFRNONAA 16 (L) 04/14/2023 0732   GFRAA 50 (L) 08/06/2018 1125   CBC    Component Value Date/Time   WBC 12.5 (H) 04/14/2023 0732   RBC 1.77 (L) 04/14/2023 0732   HGB 5.5 (LL) 04/14/2023 0732   HCT 17.2 (L) 04/14/2023 0732   PLT 436 (H) 04/14/2023 0732   MCV 97.2 04/14/2023 0732   MCH 31.1 04/14/2023 0732   MCHC 32.0 04/14/2023 0732   RDW 15.0 04/14/2023 0732   LYMPHSABS 1.3 04/13/2023 0810   MONOABS 0.9 04/13/2023 0810   EOSABS 0.1 04/13/2023 0810   BASOSABS 0.1 04/13/2023 0810    Assessment/Plan:  AKI/CKD stage IIIa vs progressive CKD stage IV - unknown baseline Scr as no labs since 2019.  Current presentation, likely ischemic ATN in setting of poor po intake and symptomatic ABLA with Hgb of 6.5.  Scr mildly improved to 3.9 this morning.  Hopefully will continue to improve with blood transfusion.  CT of abd and pelvis without obstruction.  UA with small blood and protein.  No uremic symptoms.  No indication for dialysis at this time.  Will follow UOP and SCr after he receives blood transfusion.  Will order acute GN  workup given blood and protein in his urine and thrombosis of his left peroneal vein.    Avoid nephrotoxic medications including NSAIDs and iodinated intravenous contrast exposure unless the latter is absolutely indicated.  Preferred narcotic agents for pain control are hydromorphone, fentanyl, and methadone. Morphine should not be used. Avoid Baclofen and avoid oral sodium phosphate and magnesium citrate based laxatives / bowel preps. Continue strict Input and Output monitoring. Will monitor the patient closely with you and intervene or adjust therapy as indicated by changes in clinical status/labs  ABLA - had episode of melena at [facility], heme +.  Will need blood transfusion and GI evaluation. Left peroneal vein thrombosis - cannot use anticoagulation given GIB.  Workup  per primary svc. Hypokalemia - replete and follow.   Hypomagnesium - replete and follow.  Etoh abuse - he admits to drinking again but thinks the last drink was 2 weeks ago, although he said he drinks so he can sleep due to the pain in his feet which just started this week.    Irena Cords, MD [facility]

## 2023-04-14 NOTE — Progress Notes (Signed)
Pt has refused all lab draws throughout the shift. Pt has removed an IV line, stating it is no longer needed. Pt also refuses to drink Potassium; Pt is upset regarding his diet. This RN has educated pt on the importance of medical treatment.

## 2023-04-14 NOTE — Progress Notes (Addendum)
PROGRESS NOTE        PATIENT DETAILS Name: Timothy Chambers Age: 67 y.o. Sex: male Date of Birth: 09-17-1956 Admit Date: 04/13/2023 Admitting Physician Emeline General, MD ZOX:WRUEAVW, [name], MD  Brief Summary: Patient is a 67 y.o.  male with HTN, CKD stage IIIa-who presented with weakness/malaise, poor oral intake, LLE pain, maroon-colored stools--found to have lower GI bleeding with acute blood loss anemia, AKI, LLE DVT.  Significant events: 7/2>> admit to [facility]  Significant studies: 7/2>> CT abdomen/pelvis: No acute findings.  No hydronephrosis.  Diverticulosis. 7/2>> CXR: No PNA 7/2>> LLE Doppler: Left lower extremity DVT.  Significant microbiology data: None  Procedures: 7/3>> IVC filter placement by IR  Consults: Pathology GI IR.  Subjective: No maroon-colored stools-no melena since hospitalization.  Wants to eat.  Had refused blood work/blood transfusion overnight-Long discussion with patient-along with RN at bedside-we went over his medical issues-treatment plan-and has subsequently agreed to blood transfusion/medication/IVC filter and subsequent endoscopy.  Since he desperately wants to eat-since he is not bleeding-I will go ahead and put him on a diet-hopefully we can pursue endoscopic evaluation over the next several days.  Objective: Vitals: Blood pressure 125/68, pulse 86, temperature 98.2 F (36.8 C), temperature source Oral, resp. rate 10, height 5\' 5"  (1.651 m), weight 59 kg, SpO2 100 %.   Exam: Gen Exam:Alert awake-not in any distress HEENT:atraumatic, normocephalic Chest: B/L clear to auscultation anteriorly CVS:S1S2 regular Abdomen:soft non tender, non distended Extremities:no edema Neurology: Non focal Skin: no rash  Pertinent Labs/Radiology:    Latest Ref Rng & Units 04/14/2023    7:32 AM 04/13/2023    8:10 AM 04/13/2023    7:48 AM  CBC  WBC 4.0 - 10.5 K/uL 12.5  14.0  12.1   Hemoglobin 13.0 - 17.0 g/dL 5.5  6.5  6.3    Hematocrit 39.0 - 52.0 % 17.2  19.3  18.9   Platelets 150 - 400 K/uL 436  526  531     Lab Results  Component Value Date   NA 140 04/14/2023   K 3.4 (L) 04/14/2023   CL 118 (H) 04/14/2023   CO2 13 (L) 04/14/2023      Assessment/Plan: AKI on CKD stage IIIa Hemodynamically mediated in the setting of GI bleeding/NSAID use UA with some protein-CT abdomen without and hydronephrosis Renal function improving with supportive care Autoimmune workup for GN pending.   Avoid nephrotoxic agents.  GI bleeding (maroon-colored stools seen on 7/2 at med center [facility]) with acute blood loss anemia No further GI bleeding since presentation yesterday Refused PRBC transfusion Hemoglobin down to 5.5 After long discussion-he is agreeable to transfusion-risks/benefits explained in detail-transfused 2 units. He is very adamant about NOT being n.p.o.-and wants to eat desperately-since he has not had any further GI bleeding since presentation-and putting him on a diet will enhance cooperation for blood transfusion/IVC filter etc.-I will go ahead put him on a diet for now.  Hopefully we can get endoscopic evaluation over the next several days.  Multifactorial anemia Suspect anemia as not just from ABLA-suspect some amount of anemia from underlying CKD. Transfuse 2 units as planned today Follow CBC.  LLE DVT Unable to anticoagulate IR planning IVC filter today.  Hypokalemia/hypomagnesemia Replete/recheck  Nonanion gap metabolic acidosis Secondary to CKD/AKI Hopefully will improve as renal function continues to improve.  HTN BP stable on amlodipine.  Medical noncompliance Refused blood work this morning/blood transfusion last night Long discussion-patient aware of life-threatening/life disabling effects of severity of anemia/AKI-and need for transfusion and close monitoring. He is obsessed about food and does not want to remain n.p.o. any longer-will place him on a diet-he does not appear to  be acutely bleeding for now He has agreed to get blood transfusion/IVC filter/blood work done-as long as he is given food today.  BMI: Estimated body mass index is 21.63 kg/m as calculated from the following:   Height as of this encounter: 5\' 5"  (1.651 m).   Weight as of this encounter: 59 kg.   Code status:   Code Status: Full Code   DVT Prophylaxis:SCD's   Family Communication: None at bedside  Disposition Plan: Status is: Inpatient Remains inpatient appropriate because: Severity of illness   Planned Discharge Destination:Home   Diet: Diet Order             Diet Heart Room service appropriate? Yes; Fluid consistency: Thin  Diet effective now                     Antimicrobial agents: Anti-infectives (From admission, onward)    None        MEDICATIONS: Scheduled Meds:  sodium chloride   Intravenous Once   sodium chloride   Intravenous Once   acetaminophen  650 mg Oral Once   amLODipine  5 mg Oral Daily   diphenhydrAMINE  25 mg Oral Once   feeding supplement  237 mL Oral BID BM   lidocaine  20 mL Intradermal Once   lidocaine  20 mL Infiltration Once   [START ON 04/16/2023] pantoprazole  40 mg Intravenous Q12H   potassium chloride  40 mEq Oral Once   Continuous Infusions:  sodium chloride 125 mL/hr at 04/14/23 0039   pantoprazole 8 mg/hr (04/14/23 0629)   PRN Meds:.acetaminophen **OR** acetaminophen, hydrALAZINE, HYDROmorphone (DILAUDID) injection, mouth rinse   I have personally reviewed following labs and imaging studies  LABORATORY DATA: CBC: Recent Labs  Lab 04/13/23 0748 04/13/23 0810 04/14/23 0732  WBC 12.1* 14.0* 12.5*  NEUTROABS 10.0* 11.4*  --   HGB 6.3* 6.5* 5.5*  HCT 18.9* 19.3* 17.2*  MCV 94.5 95.5 97.2  PLT 531* 526* 436*    Basic Metabolic Panel: Recent Labs  Lab 04/13/23 0748 04/14/23 0732  NA 140 140  K 2.6* 3.4*  CL 111 118*  CO2 16* 13*  GLUCOSE 103* 98  BUN 57* 44*  CREATININE 4.28* 3.90*  CALCIUM 7.6* 7.2*   MG 1.0* 1.3*    GFR: Estimated Creatinine Clearance: 15.8 mL/min (A) (by C-G formula based on SCr of 3.9 mg/dL (H)).  Liver Function Tests: Recent Labs  Lab 04/13/23 0748  AST 34  ALT 23  ALKPHOS 125  BILITOT 0.6  PROT 6.4*  ALBUMIN 2.6*   No results for input(s): "LIPASE", "AMYLASE" in the last 168 hours. No results for input(s): "AMMONIA" in the last 168 hours.  Coagulation Profile: Recent Labs  Lab 04/13/23 0750  INR 1.0    Cardiac Enzymes: No results for input(s): "CKTOTAL", "CKMB", "CKMBINDEX", "TROPONINI" in the last 168 hours.  BNP (last 3 results) No results for input(s): "PROBNP" in the last 8760 hours.  Lipid Profile: No results for input(s): "CHOL", "HDL", "LDLCALC", "TRIG", "CHOLHDL", "LDLDIRECT" in the last 72 hours.  Thyroid Function Tests: Recent Labs    04/13/23 0750  TSH 1.621  FREET4 0.96    Anemia Panel: Recent Labs  04/13/23 0809  VITAMINB12 454  FOLATE 4.4*  FERRITIN 327  TIBC 204*  IRON 22*  RETICCTPCT 2.3    Urine analysis:    Component Value Date/Time   COLORURINE YELLOW 04/13/2023 0818   APPEARANCEUR CLEAR 04/13/2023 0818   LABSPEC 1.015 04/13/2023 0818   PHURINE 5.5 04/13/2023 0818   GLUCOSEU NEGATIVE 04/13/2023 0818   HGBUR SMALL (A) 04/13/2023 0818   BILIRUBINUR NEGATIVE 04/13/2023 0818   KETONESUR NEGATIVE 04/13/2023 0818   PROTEINUR 100 (A) 04/13/2023 0818   UROBILINOGEN 0.2 03/10/2013 1153   NITRITE NEGATIVE 04/13/2023 0818   LEUKOCYTESUR NEGATIVE 04/13/2023 0818    Sepsis Labs: Lactic Acid, Venous No results found for: "LATICACIDVEN"  MICROBIOLOGY: No results found for this or any previous visit (from the past 240 hour(s)).  RADIOLOGY STUDIES/RESULTS: CT ABDOMEN PELVIS WO CONTRAST  Result Date: 04/13/2023 CLINICAL DATA:  Acute renal failure, bilateral lower extremity swelling and pain EXAM: CT ABDOMEN AND PELVIS WITHOUT CONTRAST TECHNIQUE: Multidetector CT imaging of the abdomen and pelvis was  performed following the standard protocol without IV contrast. RADIATION DOSE REDUCTION: This exam was performed according to the departmental dose-optimization program which includes automated exposure control, adjustment of the mA and/or kV according to patient size and/or use of iterative reconstruction technique. COMPARISON:  None Available. FINDINGS: Lower chest: No acute abnormality. Hepatobiliary: No solid liver abnormality is seen. Hepatic steatosis. No gallstones, gallbladder wall thickening, or biliary dilatation. Pancreas: Unremarkable. No pancreatic ductal dilatation or surrounding inflammatory changes. Spleen: Normal in size without significant abnormality. Adrenals/Urinary Tract: Adrenal glands are unremarkable. Kidneys are normal, without renal calculi, solid lesion, or hydronephrosis. Bladder is unremarkable. Stomach/Bowel: Stomach is within normal limits. Appendix appears normal. No evidence of bowel wall thickening, distention, or inflammatory changes. Sigmoid diverticulosis. Vascular/Lymphatic: Aortic atherosclerosis. No enlarged abdominal or pelvic lymph nodes. Reproductive: No mass or other significant abnormality. Other: No abdominal wall hernia or abnormality. No ascites. Musculoskeletal: No acute or significant osseous findings. IMPRESSION: 1. No acute noncontrast CT findings of the abdomen or pelvis to explain acute renal failure. No urinary tract calculi or hydronephrosis. 2. Hepatic steatosis. 3. Sigmoid diverticulosis without evidence of acute diverticulitis. Aortic Atherosclerosis (ICD10-I70.0). Electronically Signed   By: Jearld Lesch M.D.   On: 04/13/2023 09:47   US Venous Img Lower Unilateral Left  Result Date: 04/13/2023 CLINICAL DATA:  Bilateral foot pain Left lower extremity edema EXAM: LEFT LOWER EXTREMITY VENOUS DOPPLER ULTRASOUND TECHNIQUE: Gray-scale sonography with compression, as well as color and duplex ultrasound, were performed to evaluate the deep venous system(s)  from the level of the common femoral vein through the popliteal and proximal calf veins. COMPARISON:  None Available. FINDINGS: VENOUS Short segment thrombosis of the peroneal vein in the mid calf which is expanded and noncompressible with no color flow. Normal compressibility of the common femoral, superficial femoral, and popliteal veins. Visualized portions of profunda femoral vein and great saphenous vein unremarkable. No additional filling defects to suggest DVT on grayscale or color Doppler imaging. Doppler waveforms show otherwise normal direction of venous flow, normal respiratory plasticity and response to augmentation. Limited views of the contralateral common femoral vein are unremarkable. OTHER None. Limitations: none IMPRESSION: Short-segment acute thrombosis of the left peroneal vein in the mid calf. No additional left lower extremity DVT. Electronically Signed   By: Acquanetta Belling M.D.   On: 04/13/2023 09:46   DG Chest Portable 1 View  Result Date: 04/13/2023 CLINICAL DATA:  Three day history of bilateral lower extremity swelling and pain EXAM:  PORTABLE CHEST 1 VIEW COMPARISON:  Chest radiograph dated 08/06/2018 FINDINGS: Normal lung volumes. No focal consolidations. No pleural effusion or pneumothorax. The heart size and mediastinal contours are within normal limits. No acute osseous abnormality. IMPRESSION: No active disease. Electronically Signed   By: Agustin Cree M.D.   On: 04/13/2023 09:44     LOS: 1 day   Jeoffrey Massed, MD  [facility]    To contact the attending provider between 7A-7P or the covering provider during after hours 7P-7A, please log into the web site [email or web address] and access using universal [place] password for that web site. If you do not have the password, please call the hospital operator.  04/14/2023, 9:37 AM

## 2023-04-14 NOTE — Progress Notes (Signed)
IVT consult placed for 2nd PIV site for blood transfusion. Patient off the floor upon arrival to unit. Requested RN place new consult when patient returns.   Dyquan Minks Loyola Mast, RN

## 2023-04-14 NOTE — Procedures (Signed)
Vascular and Interventional Radiology Procedure Note  Patient: Timothy Chambers DOB: October 30, 1955 Medical Record Number: [id number] Note Date/Time: 04/14/23 10:43 AM   Performing Physician: Roanna Banning, MD Assistant(s): None  Diagnosis: LLE DVT. Unable to anticoagulate 2/2 GIB  Procedure:  INTRAVASCULAR ULTRASOUND (IVUS)  INFERIOR VENA CAVA FILTER PLACEMENT   Anesthesia: Local Anesthetic Complications: None Estimated Blood Loss: Minimal Specimens: None  Findings:  - access via the RIGHT femoral vein. - successful placement of infrarenal IVC filter. - IVUS and FL-guided. No CO2 nor contrast administered - No obvious abnormality was identified at this time.  Plan: *IVC filters can cause complications when left in place for extended periods of time. If medically appropriate, recommend discontinuing filter prior to discharge.  *Please re-evaluate the patient for filter discontinuation when they are seen in follow up, and refer patient to Interventional Radiology for removal.   Final report to follow once all images are reviewed and compared with previous studies.  See detailed dictation with images in PACS. The patient tolerated the procedure well without incident or complication and was returned to Floor Bed in stable condition.    Roanna Banning, MD [facility] Radiology   Pager. [phone] Clinic. [phone]

## 2023-04-15 DIAGNOSIS — I82452 Acute embolism and thrombosis of left peroneal vein: Secondary | ICD-10-CM | POA: Diagnosis not present

## 2023-04-15 DIAGNOSIS — D124 Benign neoplasm of descending colon: Secondary | ICD-10-CM | POA: Diagnosis not present

## 2023-04-15 DIAGNOSIS — D509 Iron deficiency anemia, unspecified: Secondary | ICD-10-CM

## 2023-04-15 DIAGNOSIS — K921 Melena: Secondary | ICD-10-CM

## 2023-04-15 DIAGNOSIS — K922 Gastrointestinal hemorrhage, unspecified: Secondary | ICD-10-CM | POA: Diagnosis not present

## 2023-04-15 DIAGNOSIS — D125 Benign neoplasm of sigmoid colon: Secondary | ICD-10-CM | POA: Diagnosis not present

## 2023-04-15 DIAGNOSIS — K2990 Gastroduodenitis, unspecified, without bleeding: Secondary | ICD-10-CM | POA: Diagnosis not present

## 2023-04-15 DIAGNOSIS — E876 Hypokalemia: Secondary | ICD-10-CM | POA: Diagnosis not present

## 2023-04-15 DIAGNOSIS — K6389 Other specified diseases of intestine: Secondary | ICD-10-CM | POA: Diagnosis not present

## 2023-04-15 DIAGNOSIS — I10 Essential (primary) hypertension: Secondary | ICD-10-CM | POA: Diagnosis not present

## 2023-04-15 DIAGNOSIS — N179 Acute kidney failure, unspecified: Secondary | ICD-10-CM | POA: Diagnosis not present

## 2023-04-15 DIAGNOSIS — R600 Localized edema: Secondary | ICD-10-CM | POA: Diagnosis not present

## 2023-04-15 DIAGNOSIS — K209 Esophagitis, unspecified without bleeding: Secondary | ICD-10-CM | POA: Diagnosis not present

## 2023-04-15 LAB — TYPE AND SCREEN
ABO/RH(D): O POS
Antibody Screen: NEGATIVE

## 2023-04-15 LAB — BPAM RBC
Blood Product Expiration Date: 202408022359
ISSUE DATE / TIME: 202407031045
ISSUE DATE / TIME: 202407031424
Unit Type and Rh: 5100

## 2023-04-15 LAB — PROTEIN ELECTROPHORESIS, SERUM
A/G Ratio: 0.9 (ref 0.7–1.7)
Albumin ELP: 2.5 g/dL — ABNORMAL LOW (ref 2.9–4.4)
Alpha-1-Globulin: 0.4 g/dL (ref 0.0–0.4)
Alpha-2-Globulin: 0.9 g/dL (ref 0.4–1.0)
Beta Globulin: 0.9 g/dL (ref 0.7–1.3)
Gamma Globulin: 0.6 g/dL (ref 0.4–1.8)
Globulin, Total: 2.8 g/dL (ref 2.2–3.9)
Total Protein ELP: 5.3 g/dL — ABNORMAL LOW (ref 6.0–8.5)

## 2023-04-15 LAB — COMPREHENSIVE METABOLIC PANEL
ALT: 57 U/L — ABNORMAL HIGH (ref 0–44)
AST: 97 U/L — ABNORMAL HIGH (ref 15–41)
Albumin: 2.1 g/dL — ABNORMAL LOW (ref 3.5–5.0)
Alkaline Phosphatase: 290 U/L — ABNORMAL HIGH (ref 38–126)
Anion gap: 15 (ref 5–15)
BUN: 42 mg/dL — ABNORMAL HIGH (ref 8–23)
CO2: 9 mmol/L — ABNORMAL LOW (ref 22–32)
Calcium: 7.8 mg/dL — ABNORMAL LOW (ref 8.9–10.3)
Chloride: 117 mmol/L — ABNORMAL HIGH (ref 98–111)
Creatinine, Ser: 3.83 mg/dL — ABNORMAL HIGH (ref 0.61–1.24)
GFR, Estimated: 17 mL/min — ABNORMAL LOW (ref >60)
Glucose, Bld: 156 mg/dL — ABNORMAL HIGH (ref 70–99)
Potassium: 4.5 mmol/L (ref 3.5–5.1)
Sodium: 141 mmol/L (ref 135–145)
Total Bilirubin: 0.7 mg/dL (ref 0.3–1.2)
Total Protein: 5.7 g/dL — ABNORMAL LOW (ref 6.5–8.1)

## 2023-04-15 LAB — CBC
HCT: 28.9 % — ABNORMAL LOW (ref 39.0–52.0)
Hemoglobin: 9.4 g/dL — ABNORMAL LOW (ref 13.0–17.0)
MCH: 31.2 pg (ref 26.0–34.0)
MCHC: 32.5 g/dL (ref 30.0–36.0)
MCV: 96 fL (ref 80.0–100.0)
Platelets: 449 10*3/uL — ABNORMAL HIGH (ref 150–400)
RBC: 3.01 MIL/uL — ABNORMAL LOW (ref 4.22–5.81)
RDW: 16.1 % — ABNORMAL HIGH (ref 11.5–15.5)
WBC: 17.4 10*3/uL — ABNORMAL HIGH (ref 4.0–10.5)
nRBC: 0 % (ref 0.0–0.2)

## 2023-04-15 LAB — COMPLEMENT, TOTAL: Compl, Total (CH50): >60 U/mL (ref >41)

## 2023-04-15 LAB — MAGNESIUM: Magnesium: 2.3 mg/dL (ref 1.7–2.4)

## 2023-04-15 MED ORDER — BISACODYL 5 MG PO TBEC
10.0000 mg | DELAYED_RELEASE_TABLET | Freq: Every day | ORAL | Status: DC
  Administered 2023-04-15 – 2023-04-16 (×2): 10 mg via ORAL
  Filled 2023-04-15 (×2): qty 2

## 2023-04-15 MED ORDER — BISACODYL 5 MG PO TBEC
10.0000 mg | DELAYED_RELEASE_TABLET | Freq: Once | ORAL | Status: AC
  Administered 2023-04-15: 10 mg via ORAL
  Filled 2023-04-15: qty 2

## 2023-04-15 MED ORDER — STERILE WATER FOR INJECTION IV SOLN
INTRAVENOUS | Status: DC
  Filled 2023-04-15 (×3): qty 1000

## 2023-04-15 MED ORDER — PEG-KCL-NACL-NASULF-NA ASC-C 100 G PO SOLR
0.5000 | Freq: Once | ORAL | Status: AC
  Administered 2023-04-15: 100 g via ORAL
  Filled 2023-04-15: qty 1

## 2023-04-15 MED ORDER — AMLODIPINE BESYLATE 10 MG PO TABS
10.0000 mg | ORAL_TABLET | Freq: Every day | ORAL | Status: DC
  Administered 2023-04-16: 10 mg via ORAL
  Filled 2023-04-15: qty 1

## 2023-04-15 MED ORDER — AMLODIPINE BESYLATE 5 MG PO TABS
5.0000 mg | ORAL_TABLET | Freq: Once | ORAL | Status: AC
  Administered 2023-04-15: 5 mg via ORAL
  Filled 2023-04-15: qty 1

## 2023-04-15 MED ORDER — PEG-KCL-NACL-NASULF-NA ASC-C 100 G PO SOLR: 1.0000 | Freq: Once | ORAL | Status: DC

## 2023-04-15 NOTE — Plan of Care (Signed)
  Problem: Education: Goal: Knowledge of General Education information will improve Description: Including pain rating scale, medication(s)/side effects and non-pharmacologic comfort measures Outcome: Progressing   Problem: Health Behavior/Discharge Planning: Goal: Ability to manage health-related needs will improve Outcome: Progressing   Problem: Clinical Measurements: Goal: Ability to maintain clinical measurements within normal limits will improve Outcome: Progressing Goal: Will remain free from infection Outcome: Progressing Goal: Diagnostic test results will improve Outcome: Progressing Goal: Respiratory complications will improve Outcome: Progressing Goal: Cardiovascular complication will be avoided Outcome: Progressing   Problem: Activity: Goal: Risk for activity intolerance will decrease Outcome: Progressing   Problem: Nutrition: Goal: Adequate nutrition will be maintained Outcome: Progressing   Problem: Coping: Goal: Level of anxiety will decrease Outcome: Progressing   Problem: Elimination: Goal: Will not experience complications related to bowel motility Outcome: Progressing Goal: Will not experience complications related to urinary retention Outcome: Progressing   Problem: Pain Managment: Goal: General experience of comfort will improve Outcome: Progressing   Problem: Safety: Goal: Ability to remain free from injury will improve Outcome: Progressing   Problem: Skin Integrity: Goal: Risk for impaired skin integrity will decrease Outcome: Progressing   Problem: Education: Goal: Knowledge of disease and its progression will improve Outcome: Progressing   Problem: Health Behavior/Discharge Planning: Goal: Ability to manage health-related needs will improve Outcome: Progressing   Problem: Clinical Measurements: Goal: Complications related to the disease process or treatment will be avoided or minimized Outcome: Progressing Goal: Dialysis access  will remain free of complications Outcome: Progressing   Problem: Activity: Goal: Activity intolerance will improve Outcome: Progressing   Problem: Fluid Volume: Goal: Fluid volume balance will be maintained or improved Outcome: Progressing   Problem: Nutritional: Goal: Ability to make appropriate dietary choices will improve Outcome: Progressing   Problem: Respiratory: Goal: Respiratory symptoms related to disease process will be avoided Outcome: Progressing   Problem: Self-Concept: Goal: Body image disturbance will be avoided or minimized Outcome: Progressing   Problem: Urinary Elimination: Goal: Progression of disease will be identified and treated Outcome: Progressing   Problem: Education: Goal: Ability to identify signs and symptoms of gastrointestinal bleeding will improve Outcome: Progressing   Problem: Bowel/Gastric: Goal: Will show no signs and symptoms of gastrointestinal bleeding Outcome: Progressing   Problem: Fluid Volume: Goal: Will show no signs and symptoms of excessive bleeding Outcome: Progressing   Problem: Clinical Measurements: Goal: Complications related to the disease process, condition or treatment will be avoided or minimized Outcome: Progressing   Problem: Education: Goal: Understanding of CV disease, CV risk reduction, and recovery process will improve Outcome: Progressing Goal: Individualized Educational Video(s) Outcome: Progressing   Problem: Activity: Goal: Ability to return to baseline activity level will improve Outcome: Progressing   Problem: Cardiovascular: Goal: Ability to achieve and maintain adequate cardiovascular perfusion will improve Outcome: Progressing Goal: Vascular access site(s) Level 0-1 will be maintained Outcome: Progressing   Problem: Health Behavior/Discharge Planning: Goal: Ability to safely manage health-related needs after discharge will improve Outcome: Progressing

## 2023-04-15 NOTE — Progress Notes (Signed)
PROGRESS NOTE        PATIENT DETAILS Name: Timothy Chambers Age: 67 y.o. Sex: male Date of Birth: [birth year] Admit Date: 04/13/2023 Admitting Physician Emeline General, MD BJY:[id number], [name], MD  Brief Summary: Patient is a 67 y.o.  male with HTN, CKD stage IIIa-who presented with weakness/malaise, poor oral intake, LLE pain, maroon-colored stools--found to have lower GI bleeding with acute blood loss anemia, AKI, LLE DVT.  Significant events: 7/2>> admit to [facility]  Significant studies: 7/2>> CT abdomen/pelvis: No acute findings.  No hydronephrosis.  Diverticulosis. 7/2>> CXR: No PNA 7/2>> LLE Doppler: Left lower extremity DVT.  Significant microbiology data: None  Procedures: 7/3>> IVC filter placement by IR  Consults: Pathology GI IR.  Subjective: No bleeding overnight.  No chest pain or shortness of breath.  Objective: Vitals: Blood pressure (!) 188/90, pulse (!) 101, temperature 98 F (36.7 C), temperature source Oral, resp. rate 17, height 5\' 5"  (1.651 m), weight 59 kg, SpO2 94 %.   Exam: Gen Exam:Alert awake-not in any distress HEENT:atraumatic, normocephalic Chest: B/L clear to auscultation anteriorly CVS:S1S2 regular Abdomen:soft non tender, non distended Extremities:no edema Neurology: Non focal Skin: no rash  Pertinent Labs/Radiology:    Latest Ref Rng & Units 04/15/2023    1:55 AM 04/14/2023    6:05 PM 04/14/2023    7:32 AM  CBC  WBC 4.0 - 10.5 K/uL 17.4   12.5   Hemoglobin 13.0 - 17.0 g/dL 9.4  8.5  5.5   Hematocrit 39.0 - 52.0 % 28.9  25.9  17.2   Platelets 150 - 400 K/uL 449   436     Lab Results  Component Value Date   NA 141 04/15/2023   K 4.5 04/15/2023   CL 117 (H) 04/15/2023   CO2 9 (L) 04/15/2023      Assessment/Plan: AKI on CKD stage IIIa Hemodynamically mediated in the setting of GI bleeding/NSAID use UA with some protein-CT abdomen without and hydronephrosis Renal function improving with supportive  care-but plateaued compared to yesterday Autoimmune panel negative so far Multiple myeloma workup pending Avoid nephrotoxic agents-continue supportive care and follow renal function.  GI bleeding (maroon-colored stools seen on 7/2 at med center [facility]) with acute blood loss anemia No further GI bleeding since admission on 7/2  Hb now stable after 2 units of PRBC transfusion on 7/3  He is agreeable to pursuing endoscopy evaluation-tentatively scheduled for 7/5.   Continue PPI and follow CBC.  Multifactorial anemia Suspect anemia as not just from ABLA-suspect some amount of anemia from underlying CKD. Hb stable after 2 units of PRBC on 7/3  LLE DVT Unable to anticoagulate IR placed IVC filter on 7/3  Hypokalemia/hypomagnesemia Repleted-follow  Nonanion gap metabolic acidosis Secondary to CKD/AKI Discussed with nephrology-starting isotonic bicarb infusion.   Follow-up.  HTN BP stable on amlodipine.  Weight loss/cachexia Apparently unintentional TSH stable HIV negative Concern for malignancy-colonoscopy/endoscopy scheduled 7/5-CT imaging negative for malignancy. Nutrition evaluation  Medical noncompliance Initially refused blood work/PRBC etc.-after extensive discussion on 7/3-after being made aware of life-threatening/life disabling effects of refusal of treatment-he is now much more cooperative.  Agreed for PRBC transfusion and underwent IVC filter on 7/3-agreeable to pursue endoscopy evaluation to rule out malignancy on 7/5.    BMI: Estimated body mass index is 21.63 kg/m as calculated from the following:   Height as of this encounter:  5\' 5"  (1.651 m).   Weight as of this encounter: 59 kg.   Code status:   Code Status: Full Code   DVT Prophylaxis:SCD's   Family Communication: None at bedside  Disposition Plan: Status is: Inpatient Remains inpatient appropriate because: Severity of illness   Planned Discharge Destination:Home   Diet: Diet Order              Diet Heart Room service appropriate? Yes; Fluid consistency: Thin  Diet effective now                     Antimicrobial agents: Anti-infectives (From admission, onward)    None        MEDICATIONS: Scheduled Meds:  sodium chloride   Intravenous Once   amLODipine  5 mg Oral Daily   feeding supplement  237 mL Oral BID BM   lidocaine  20 mL Intradermal Once   lidocaine  20 mL Infiltration Once   [START ON 04/16/2023] pantoprazole  40 mg Intravenous Q12H   Continuous Infusions:  pantoprazole 8 mg/hr (04/15/23 1215)   sodium bicarbonate 150 mEq in sterile water 1,150 mL infusion 75 mL/hr at 04/15/23 1020   PRN Meds:.acetaminophen **OR** acetaminophen, hydrALAZINE, HYDROmorphone (DILAUDID) injection, mouth rinse   I have personally reviewed following labs and imaging studies  LABORATORY DATA: CBC: Recent Labs  Lab 04/13/23 0748 04/13/23 0810 04/14/23 0732 04/14/23 1805 04/15/23 0155  WBC 12.1* 14.0* 12.5*  --  17.4*  NEUTROABS 10.0* 11.4*  --   --   --   HGB 6.3* 6.5* 5.5* 8.5* 9.4*  HCT 18.9* 19.3* 17.2* 25.9* 28.9*  MCV 94.5 95.5 97.2  --  96.0  PLT 531* 526* 436*  --  449*     Basic Metabolic Panel: Recent Labs  Lab 04/13/23 0748 04/14/23 0732 04/15/23 0155  NA 140 140 141  K 2.6* 3.4* 4.5  CL 111 118* 117*  CO2 16* 13* 9*  GLUCOSE 103* 98 156*  BUN 57* 44* 42*  CREATININE 4.28* 3.90* 3.83*  CALCIUM 7.6* 7.2* 7.8*  MG 1.0* 1.3* 2.3     GFR: Estimated Creatinine Clearance: 16 mL/min (A) (by C-G formula based on SCr of 3.83 mg/dL (H)).  Liver Function Tests: Recent Labs  Lab 04/13/23 0748 04/15/23 0155  AST 34 97*  ALT 23 57*  ALKPHOS 125 290*  BILITOT 0.6 0.7  PROT 6.4* 5.7*  ALBUMIN 2.6* 2.1*    No results for input(s): "LIPASE", "AMYLASE" in the last 168 hours. No results for input(s): "AMMONIA" in the last 168 hours.  Coagulation Profile: Recent Labs  Lab 04/13/23 0750  INR 1.0     Cardiac Enzymes: No results for  input(s): "CKTOTAL", "CKMB", "CKMBINDEX", "TROPONINI" in the last 168 hours.  BNP (last 3 results) No results for input(s): "PROBNP" in the last 8760 hours.  Lipid Profile: No results for input(s): "CHOL", "HDL", "LDLCALC", "TRIG", "CHOLHDL", "LDLDIRECT" in the last 72 hours.  Thyroid Function Tests: Recent Labs    04/13/23 0750  TSH 1.621  FREET4 0.96     Anemia Panel: Recent Labs    04/13/23 0809  VITAMINB12 454  FOLATE 4.4*  FERRITIN 327  TIBC 204*  IRON 22*  RETICCTPCT 2.3     Urine analysis:    Component Value Date/Time   COLORURINE YELLOW 04/13/2023 0818   APPEARANCEUR CLEAR 04/13/2023 0818   LABSPEC 1.015 04/13/2023 0818   PHURINE 5.5 04/13/2023 0818   GLUCOSEU NEGATIVE 04/13/2023 0818   HGBUR SMALL (A)  04/13/2023 0818   BILIRUBINUR NEGATIVE 04/13/2023 0818   KETONESUR NEGATIVE 04/13/2023 0818   PROTEINUR 100 (A) 04/13/2023 0818   UROBILINOGEN 0.2 03/10/2013 1153   NITRITE NEGATIVE 04/13/2023 0818   LEUKOCYTESUR NEGATIVE 04/13/2023 0818    Sepsis Labs: Lactic Acid, Venous No results found for: "LATICACIDVEN"  MICROBIOLOGY: No results found for this or any previous visit (from the past 240 hour(s)).  RADIOLOGY STUDIES/RESULTS: ECHOCARDIOGRAM COMPLETE  Result Date: 04/14/2023    ECHOCARDIOGRAM REPORT   Patient Name:   Timothy Chambers Date of Exam: 04/14/2023 Medical Rec #:  [id number]       Height:       65.0 in Accession #:    [id number]      Weight:       130.0 lb Date of Birth:  September 10, 1956       BSA:          1.647 m Patient Age:    67 years        BP:           160/78 mmHg Patient Gender: M               HR:           90 bpm. Exam Location:  Inpatient Procedure: 2D Echo, Cardiac Doppler and Color Doppler Indications:    CHF  History:        Patient has no prior history of Echocardiogram examinations.                 Risk Factors:Hypertension.  Sonographer:    Darlys Gales Referring Phys: [id number] PING T ZHANG IMPRESSIONS  1. Left ventricular ejection  fraction, by estimation, is 60 to 65%. The left ventricle has normal function. The left ventricle has no regional wall motion abnormalities. There is moderate left ventricular hypertrophy. Left ventricular diastolic parameters were normal.  2. Right ventricular systolic function is normal. The right ventricular size is normal. There is normal pulmonary artery systolic pressure. The estimated right ventricular systolic pressure is 30.5 mmHg.  3. The mitral valve is normal in structure. Mild mitral valve regurgitation.  4. The aortic valve is tricuspid. Aortic valve regurgitation is trivial. Aortic valve sclerosis is present, with no evidence of aortic valve stenosis.  5. The inferior vena cava is dilated in size with >50% respiratory variability, suggesting right atrial pressure of 8 mmHg. FINDINGS  Left Ventricle: Left ventricular ejection fraction, by estimation, is 60 to 65%. The left ventricle has normal function. The left ventricle has no regional wall motion abnormalities. The left ventricular internal cavity size was normal in size. There is  moderate left ventricular hypertrophy. Left ventricular diastolic parameters were normal. Right Ventricle: The right ventricular size is normal. No increase in right ventricular wall thickness. Right ventricular systolic function is normal. There is normal pulmonary artery systolic pressure. The tricuspid regurgitant velocity is 2.37 m/s, and  with an assumed right atrial pressure of 8 mmHg, the estimated right ventricular systolic pressure is 30.5 mmHg. Left Atrium: Left atrial size was normal in size. Right Atrium: Right atrial size was normal in size. Pericardium: There is no evidence of pericardial effusion. Mitral Valve: The mitral valve is normal in structure. Mild mitral valve regurgitation. Tricuspid Valve: The tricuspid valve is normal in structure. Tricuspid valve regurgitation is trivial. Aortic Valve: The aortic valve is tricuspid. Aortic valve regurgitation  is trivial. Aortic valve sclerosis is present, with no evidence of aortic valve stenosis. Aortic valve mean gradient measures 4.0 mmHg.  Aortic valve peak gradient measures 7.0 mmHg. Aortic valve area, by VTI measures 2.52 cm. Pulmonic Valve: The pulmonic valve was not well visualized. Pulmonic valve regurgitation is trivial. Aorta: The aortic root and ascending aorta are structurally normal, with no evidence of dilitation. Venous: The inferior vena cava is dilated in size with greater than 50% respiratory variability, suggesting right atrial pressure of 8 mmHg. IAS/Shunts: The interatrial septum was not well visualized.  LEFT VENTRICLE PLAX 2D LVIDd:         4.30 cm   Diastology LVIDs:         3.20 cm   LV e' medial:    0.11 cm/s LV PW:         1.30 cm   LV E/e' medial:  10.6 LV IVS:        1.30 cm   LV e' lateral:   0.09 cm/s LVOT diam:     1.90 cm   LV E/e' lateral: 12.9 LV SV:         61 LV SV Index:   37 LVOT Area:     2.84 cm  RIGHT VENTRICLE             IVC RV S prime:     11.90 cm/s  IVC diam: 1.80 cm TAPSE (M-mode): 2.4 cm LEFT ATRIUM             Index        RIGHT ATRIUM           Index LA Vol (A2C):   59.9 ml 36.36 ml/m  RA Area:     10.50 cm LA Vol (A4C):   46.5 ml 28.23 ml/m  RA Volume:   20.60 ml  12.51 ml/m LA Biplane Vol: 52.8 ml 32.05 ml/m  AORTIC VALVE AV Area (Vmax):    2.62 cm AV Area (Vmean):   2.69 cm AV Area (VTI):     2.52 cm AV Vmax:           132.00 cm/s AV Vmean:          89.100 cm/s AV VTI:            0.243 m AV Peak Grad:      7.0 mmHg AV Mean Grad:      4.0 mmHg LVOT Vmax:         122.00 cm/s LVOT Vmean:        84.400 cm/s LVOT VTI:          0.216 m LVOT/AV VTI ratio: 0.89  AORTA Ao Root diam: 3.50 cm Ao Asc diam:  3.20 cm MITRAL VALVE                  TRICUSPID VALVE MV Area (PHT): 3.72 cm       TR Peak grad:   22.5 mmHg MV Decel Time: 204 msec       TR Vmax:        237.00 cm/s MR Peak grad:    131.3 mmHg MR Mean grad:    97.0 mmHg    SHUNTS MR Vmax:         573.00 cm/s   Systemic VTI:  0.22 m MR Vmean:        469.0 cm/s   Systemic Diam: 1.90 cm MR PISA:         4.02 cm MR PISA Eff ROA: 27 mm MR PISA Radius:  0.80 cm MV E velocity: 1.12 cm/s MV A velocity: 85.90 cm/s MV E/A  ratio:  0.01 Epifanio Lesches MD Electronically signed by Epifanio Lesches MD Signature Date/Time: 04/14/2023/5:18:54 PM    Final      LOS: 2 days   Jeoffrey Massed, MD  [facility]    To contact the attending provider between 7A-7P or the covering provider during after hours 7P-7A, please log into the web site [email or web address] and access using universal [place] password for that web site. If you do not have the password, please call the hospital operator.  04/15/2023, 12:48 PM

## 2023-04-15 NOTE — Progress Notes (Signed)
Patient ID: Timothy Chambers, male   DOB: 30-Mar-1956, 67 y.o.   MRN: [id number] S: No new complaints. O:BP (!) 178/84 (BP Location: Left Arm)   Pulse 99   Temp 98.9 F (37.2 C) (Oral)   Resp 19   Ht 5\' 5"  (1.651 m)   Wt 59 kg   SpO2 95%   BMI 21.63 kg/m   Intake/Output Summary (Last 24 hours) at 04/15/2023 1106 Last data filed at 04/15/2023 1044 Gross per 24 hour  Intake 1114 ml  Output 1100 ml  Net 14 ml   Intake/Output: I/O last 3 completed shifts: In: 2232 [P.O.:480; I.V.:1118; Blood:634] Out: 1000 [Urine:1000]  Intake/Output this shift:  Total I/O In: -  Out: 200 [Urine:200] Weight change:  Gen: NAD CVS: RRR Resp:CTA Abd: +BS, soft, NT/ND Ext:no edema  Recent Labs  Lab 04/13/23 0748 04/14/23 0732 04/15/23 0155  NA 140 140 141  K 2.6* 3.4* 4.5  CL 111 118* 117*  CO2 16* 13* 9*  GLUCOSE 103* 98 156*  BUN 57* 44* 42*  CREATININE 4.28* 3.90* 3.83*  ALBUMIN 2.6*  --  2.1*  CALCIUM 7.6* 7.2* 7.8*  AST 34  --  97*  ALT 23  --  57*   Liver Function Tests: Recent Labs  Lab 04/13/23 0748 04/15/23 0155  AST 34 97*  ALT 23 57*  ALKPHOS 125 290*  BILITOT 0.6 0.7  PROT 6.4* 5.7*  ALBUMIN 2.6* 2.1*   No results for input(s): "LIPASE", "AMYLASE" in the last 168 hours. No results for input(s): "AMMONIA" in the last 168 hours. CBC: Recent Labs  Lab 04/13/23 0748 04/13/23 0810 04/14/23 0732 04/14/23 1805 04/15/23 0155  WBC 12.1* 14.0* 12.5*  --  17.4*  NEUTROABS 10.0* 11.4*  --   --   --   HGB 6.3* 6.5* 5.5* 8.5* 9.4*  HCT 18.9* 19.3* 17.2* 25.9* 28.9*  MCV 94.5 95.5 97.2  --  96.0  PLT 531* 526* 436*  --  449*   Cardiac Enzymes: No results for input(s): "CKTOTAL", "CKMB", "CKMBINDEX", "TROPONINI" in the last 168 hours. CBG: No results for input(s): "GLUCAP" in the last 168 hours.  Iron Studies:  Recent Labs    04/13/23 0809  IRON 22*  TIBC 204*  FERRITIN 327   Studies/Results: ECHOCARDIOGRAM COMPLETE  Result Date: 04/14/2023    ECHOCARDIOGRAM  REPORT   Patient Name:   Timothy Chambers Date of Exam: 04/14/2023 Medical Rec #:  [id number]       Height:       65.0 in Accession #:    [id number]      Weight:       130.0 lb Date of Birth:  07/28/1956       BSA:          1.647 m Patient Age:    67 years        BP:           160/78 mmHg Patient Gender: M               HR:           90 bpm. Exam Location:  Inpatient Procedure: 2D Echo, Cardiac Doppler and Color Doppler Indications:    CHF  History:        Patient has no prior history of Echocardiogram examinations.                 Risk Factors:Hypertension.  Sonographer:    Darlys Gales Referring Phys: [id number] Emeline General IMPRESSIONS  1. Left ventricular ejection fraction, by estimation, is 60 to 65%. The left ventricle has normal function. The left ventricle has no regional wall motion abnormalities. There is moderate left ventricular hypertrophy. Left ventricular diastolic parameters were normal.  2. Right ventricular systolic function is normal. The right ventricular size is normal. There is normal pulmonary artery systolic pressure. The estimated right ventricular systolic pressure is 30.5 mmHg.  3. The mitral valve is normal in structure. Mild mitral valve regurgitation.  4. The aortic valve is tricuspid. Aortic valve regurgitation is trivial. Aortic valve sclerosis is present, with no evidence of aortic valve stenosis.  5. The inferior vena cava is dilated in size with >50% respiratory variability, suggesting right atrial pressure of 8 mmHg. FINDINGS  Left Ventricle: Left ventricular ejection fraction, by estimation, is 60 to 65%. The left ventricle has normal function. The left ventricle has no regional wall motion abnormalities. The left ventricular internal cavity size was normal in size. There is  moderate left ventricular hypertrophy. Left ventricular diastolic parameters were normal. Right Ventricle: The right ventricular size is normal. No increase in right ventricular wall thickness. Right ventricular  systolic function is normal. There is normal pulmonary artery systolic pressure. The tricuspid regurgitant velocity is 2.37 m/s, and  with an assumed right atrial pressure of 8 mmHg, the estimated right ventricular systolic pressure is 30.5 mmHg. Left Atrium: Left atrial size was normal in size. Right Atrium: Right atrial size was normal in size. Pericardium: There is no evidence of pericardial effusion. Mitral Valve: The mitral valve is normal in structure. Mild mitral valve regurgitation. Tricuspid Valve: The tricuspid valve is normal in structure. Tricuspid valve regurgitation is trivial. Aortic Valve: The aortic valve is tricuspid. Aortic valve regurgitation is trivial. Aortic valve sclerosis is present, with no evidence of aortic valve stenosis. Aortic valve mean gradient measures 4.0 mmHg. Aortic valve peak gradient measures 7.0 mmHg. Aortic valve area, by VTI measures 2.52 cm. Pulmonic Valve: The pulmonic valve was not well visualized. Pulmonic valve regurgitation is trivial. Aorta: The aortic root and ascending aorta are structurally normal, with no evidence of dilitation. Venous: The inferior vena cava is dilated in size with greater than 50% respiratory variability, suggesting right atrial pressure of 8 mmHg. IAS/Shunts: The interatrial septum was not well visualized.  LEFT VENTRICLE PLAX 2D LVIDd:         4.30 cm   Diastology LVIDs:         3.20 cm   LV e' medial:    0.11 cm/s LV PW:         1.30 cm   LV E/e' medial:  10.6 LV IVS:        1.30 cm   LV e' lateral:   0.09 cm/s LVOT diam:     1.90 cm   LV E/e' lateral: 12.9 LV SV:         61 LV SV Index:   37 LVOT Area:     2.84 cm  RIGHT VENTRICLE             IVC RV S prime:     11.90 cm/s  IVC diam: 1.80 cm TAPSE (M-mode): 2.4 cm LEFT ATRIUM             Index        RIGHT ATRIUM           Index LA Vol (A2C):   59.9 ml 36.36 ml/m  RA Area:     10.50 cm LA Vol (A4C):  46.5 ml 28.23 ml/m  RA Volume:   20.60 ml  12.51 ml/m LA Biplane Vol: 52.8 ml 32.05  ml/m  AORTIC VALVE AV Area (Vmax):    2.62 cm AV Area (Vmean):   2.69 cm AV Area (VTI):     2.52 cm AV Vmax:           132.00 cm/s AV Vmean:          89.100 cm/s AV VTI:            0.243 m AV Peak Grad:      7.0 mmHg AV Mean Grad:      4.0 mmHg LVOT Vmax:         122.00 cm/s LVOT Vmean:        84.400 cm/s LVOT VTI:          0.216 m LVOT/AV VTI ratio: 0.89  AORTA Ao Root diam: 3.50 cm Ao Asc diam:  3.20 cm MITRAL VALVE                  TRICUSPID VALVE MV Area (PHT): 3.72 cm       TR Peak grad:   22.5 mmHg MV Decel Time: 204 msec       TR Vmax:        237.00 cm/s MR Peak grad:    131.3 mmHg MR Mean grad:    97.0 mmHg    SHUNTS MR Vmax:         573.00 cm/s  Systemic VTI:  0.22 m MR Vmean:        469.0 cm/s   Systemic Diam: 1.90 cm MR PISA:         4.02 cm MR PISA Eff ROA: 27 mm MR PISA Radius:  0.80 cm MV E velocity: 1.12 cm/s MV A velocity: 85.90 cm/s MV E/A ratio:  0.01 Epifanio Lesches MD Electronically signed by Epifanio Lesches MD Signature Date/Time: 04/14/2023/5:18:54 PM    Final     sodium chloride   Intravenous Once   amLODipine  5 mg Oral Daily   feeding supplement  237 mL Oral BID BM   lidocaine  20 mL Intradermal Once   lidocaine  20 mL Infiltration Once   [START ON 04/16/2023] pantoprazole  40 mg Intravenous Q12H    BMET    Component Value Date/Time   NA 141 04/15/2023 0155   K 4.5 04/15/2023 0155   CL 117 (H) 04/15/2023 0155   CO2 9 (L) 04/15/2023 0155   GLUCOSE 156 (H) 04/15/2023 0155   BUN 42 (H) 04/15/2023 0155   CREATININE 3.83 (H) 04/15/2023 0155   CALCIUM 7.8 (L) 04/15/2023 0155   GFRNONAA 17 (L) 04/15/2023 0155   GFRAA 50 (L) 08/06/2018 1125   CBC    Component Value Date/Time   WBC 17.4 (H) 04/15/2023 0155   RBC 3.01 (L) 04/15/2023 0155   HGB 9.4 (L) 04/15/2023 0155   HCT 28.9 (L) 04/15/2023 0155   PLT 449 (H) 04/15/2023 0155   MCV 96.0 04/15/2023 0155   MCH 31.2 04/15/2023 0155   MCHC 32.5 04/15/2023 0155   RDW 16.1 (H) 04/15/2023 0155   LYMPHSABS 1.3  04/13/2023 0810   MONOABS 0.9 04/13/2023 0810   EOSABS 0.1 04/13/2023 0810   BASOSABS 0.1 04/13/2023 0810      Assessment/Plan:  AKI/CKD stage IIIa vs progressive CKD stage IV - unknown baseline Scr as no labs since 2019.  Current presentation, likely ischemic ATN in setting of poor po intake and  symptomatic ABLA with Hgb of 6.5.  Scr mildly improved to 3.83 this morning.  Hopefully will continue to improve with blood transfusion.  CT of abd and pelvis without obstruction.  UA with small blood and protein.  No uremic symptoms.  No indication for dialysis at this time.  Will follow UOP and SCr after he receives blood transfusion.  Will order acute GN workup given blood and protein in his urine and thrombosis of his left peroneal vein.    Avoid nephrotoxic medications including NSAIDs and iodinated intravenous contrast exposure unless the latter is absolutely indicated.  Preferred narcotic agents for pain control are hydromorphone, fentanyl, and methadone. Morphine should not be used. Avoid Baclofen and avoid oral sodium phosphate and magnesium citrate based laxatives / bowel preps. Continue strict Input and Output monitoring. Will monitor the patient closely with you and intervene or adjust therapy as indicated by changes in clinical status/labs  ABLA - had episode of melena at [facility], heme +.  S/p blood transfusion with increased Hgb to 9.4.  GI evaluation underway. Left peroneal vein thrombosis - cannot use anticoagulation given GIB.  Workup per primary svc. Hypokalemia - replete and follow.   Hypomagnesium - replete and follow.  Abnormal LFT's - jump in AST/ALT.  Workup per primary svc. Etoh abuse - he admits to drinking again but thinks the last drink was 2 weeks ago, although he said he drinks so he can sleep due to the pain in his feet which just started this week.    Irena Cords, MD [facility]

## 2023-04-15 NOTE — Progress Notes (Signed)
Gastroenterology Inpatient Follow-up Note   PATIENT IDENTIFICATION  Timothy Chambers is a 67 y.o. male  Hospital Day: 3  SUBJECTIVE  Patient's chart has been reviewed. The patient's labs have been reviewed. Patient is amenable now for furth right er therapies now that he has a better understanding of why he is in the hospital. The patient denies fevers or chills.   The patient denies progressive abdominal pain or discomfort currently. He states that he had another dark bowel movement last night into this morning.     OBJECTIVE  Scheduled Inpatient Medications:   sodium chloride   Intravenous Once   amLODipine  5 mg Oral Daily   feeding supplement  237 mL Oral BID BM   lidocaine  20 mL Intradermal Once   lidocaine  20 mL Infiltration Once   [START ON 04/16/2023] pantoprazole  40 mg Intravenous Q12H   Continuous Inpatient Infusions:   pantoprazole 8 mg/hr (04/15/23 1215)   sodium bicarbonate 150 mEq in sterile water 1,150 mL infusion 75 mL/hr at 04/15/23 1020   PRN Inpatient Medications: acetaminophen **OR** acetaminophen, hydrALAZINE, HYDROmorphone (DILAUDID) injection, mouth rinse   Physical Examination  Temp:  [98 F (36.7 C)-98.9 F (37.2 C)] 98 F (36.7 C) (07/04 1224) Pulse Rate:  [89-102] 101 (07/04 1224) Resp:  [11-20] 17 (07/04 1224) BP: (144-188)/(78-90) 188/90 (07/04 1224) SpO2:  [94 %-96 %] 94 % (07/04 1224) Temp (24hrs), Avg:98.4 F (36.9 C), Min:98 F (36.7 C), Max:98.9 F (37.2 C)  Weight: 59 kg GEN: NAD, appears stated age, doesn't appear chronically ill PSYCH: Cooperative, without pressured speech EYE: Conjunctivae pink, sclerae anicteric ENT: MMM CV: Nontachycardic RESP: No audible wheezing GI: NABS, soft, NT/ND, without rebound MSK/EXT: No significant lower extremity edema SKIN: No jaundice NEURO:  Alert & Oriented x 3, no focal deficits   Review of Data   Laboratory Studies   Recent Labs  Lab 04/15/23 0155  NA 141  K 4.5  CL 117*   CO2 9*  BUN 42*  CREATININE 3.83*  GLUCOSE 156*  CALCIUM 7.8*  MG 2.3   Recent Labs  Lab 04/15/23 0155  AST 97*  ALT 57*  ALKPHOS 290*    Recent Labs  Lab 04/13/23 0810 04/14/23 0732 04/14/23 1805 04/15/23 0155  WBC 14.0* 12.5*  --  17.4*  HGB 6.5* 5.5*   < > 9.4*  HCT 19.3* 17.2*   < > 28.9*  PLT 526* 436*  --  449*   < > = values in this interval not displayed.   Recent Labs  Lab 04/13/23 0750  INR 1.0   Imaging Studies  ECHOCARDIOGRAM COMPLETE  Result Date: 04/14/2023    ECHOCARDIOGRAM REPORT   Patient Name:   Timothy Chambers Date of Exam: 04/14/2023 Medical Rec #:  [id number]       Height:       65.0 in Accession #:    [id number]      Weight:       130.0 lb Date of Birth:  02-Sep-1956       BSA:          1.647 m Patient Age:    67 years        BP:           160/78 mmHg Patient Gender: M               HR:           90 bpm. Exam Location:  Inpatient Procedure: 2D Echo, Cardiac  Doppler and Color Doppler Indications:    CHF  History:        Patient has no prior history of Echocardiogram examinations.                 Risk Factors:Hypertension.  Sonographer:    Darlys Gales Referring Phys: [id number] PING T ZHANG IMPRESSIONS  1. Left ventricular ejection fraction, by estimation, is 60 to 65%. The left ventricle has normal function. The left ventricle has no regional wall motion abnormalities. There is moderate left ventricular hypertrophy. Left ventricular diastolic parameters were normal.  2. Right ventricular systolic function is normal. The right ventricular size is normal. There is normal pulmonary artery systolic pressure. The estimated right ventricular systolic pressure is 30.5 mmHg.  3. The mitral valve is normal in structure. Mild mitral valve regurgitation.  4. The aortic valve is tricuspid. Aortic valve regurgitation is trivial. Aortic valve sclerosis is present, with no evidence of aortic valve stenosis.  5. The inferior vena cava is dilated in size with >50% respiratory  variability, suggesting right atrial pressure of 8 mmHg. FINDINGS  Left Ventricle: Left ventricular ejection fraction, by estimation, is 60 to 65%. The left ventricle has normal function. The left ventricle has no regional wall motion abnormalities. The left ventricular internal cavity size was normal in size. There is  moderate left ventricular hypertrophy. Left ventricular diastolic parameters were normal. Right Ventricle: The right ventricular size is normal. No increase in right ventricular wall thickness. Right ventricular systolic function is normal. There is normal pulmonary artery systolic pressure. The tricuspid regurgitant velocity is 2.37 m/s, and  with an assumed right atrial pressure of 8 mmHg, the estimated right ventricular systolic pressure is 30.5 mmHg. Left Atrium: Left atrial size was normal in size. Right Atrium: Right atrial size was normal in size. Pericardium: There is no evidence of pericardial effusion. Mitral Valve: The mitral valve is normal in structure. Mild mitral valve regurgitation. Tricuspid Valve: The tricuspid valve is normal in structure. Tricuspid valve regurgitation is trivial. Aortic Valve: The aortic valve is tricuspid. Aortic valve regurgitation is trivial. Aortic valve sclerosis is present, with no evidence of aortic valve stenosis. Aortic valve mean gradient measures 4.0 mmHg. Aortic valve peak gradient measures 7.0 mmHg. Aortic valve area, by VTI measures 2.52 cm. Pulmonic Valve: The pulmonic valve was not well visualized. Pulmonic valve regurgitation is trivial. Aorta: The aortic root and ascending aorta are structurally normal, with no evidence of dilitation. Venous: The inferior vena cava is dilated in size with greater than 50% respiratory variability, suggesting right atrial pressure of 8 mmHg. IAS/Shunts: The interatrial septum was not well visualized.  LEFT VENTRICLE PLAX 2D LVIDd:         4.30 cm   Diastology LVIDs:         3.20 cm   LV e' medial:    0.11 cm/s LV  PW:         1.30 cm   LV E/e' medial:  10.6 LV IVS:        1.30 cm   LV e' lateral:   0.09 cm/s LVOT diam:     1.90 cm   LV E/e' lateral: 12.9 LV SV:         61 LV SV Index:   37 LVOT Area:     2.84 cm  RIGHT VENTRICLE             IVC RV S prime:     11.90 cm/s  IVC diam: 1.80 cm  TAPSE (M-mode): 2.4 cm LEFT ATRIUM             Index        RIGHT ATRIUM           Index LA Vol (A2C):   59.9 ml 36.36 ml/m  RA Area:     10.50 cm LA Vol (A4C):   46.5 ml 28.23 ml/m  RA Volume:   20.60 ml  12.51 ml/m LA Biplane Vol: 52.8 ml 32.05 ml/m  AORTIC VALVE AV Area (Vmax):    2.62 cm AV Area (Vmean):   2.69 cm AV Area (VTI):     2.52 cm AV Vmax:           132.00 cm/s AV Vmean:          89.100 cm/s AV VTI:            0.243 m AV Peak Grad:      7.0 mmHg AV Mean Grad:      4.0 mmHg LVOT Vmax:         122.00 cm/s LVOT Vmean:        84.400 cm/s LVOT VTI:          0.216 m LVOT/AV VTI ratio: 0.89  AORTA Ao Root diam: 3.50 cm Ao Asc diam:  3.20 cm MITRAL VALVE                  TRICUSPID VALVE MV Area (PHT): 3.72 cm       TR Peak grad:   22.5 mmHg MV Decel Time: 204 msec       TR Vmax:        237.00 cm/s MR Peak grad:    131.3 mmHg MR Mean grad:    97.0 mmHg    SHUNTS MR Vmax:         573.00 cm/s  Systemic VTI:  0.22 m MR Vmean:        469.0 cm/s   Systemic Diam: 1.90 cm MR PISA:         4.02 cm MR PISA Eff ROA: 27 mm MR PISA Radius:  0.80 cm MV E velocity: 1.12 cm/s MV A velocity: 85.90 cm/s MV E/A ratio:  0.01 Epifanio Lesches MD Electronically signed by Epifanio Lesches MD Signature Date/Time: 04/14/2023/5:18:54 PM    Final     GI Procedures and Studies  No new procedures to review   ASSESSMENT  Mr. Vandyken is a 67 y.o. male with a pmh significant for hypertension, CRI, diverticulosis.  Patient presented with significant weight loss, anemia and iron deficiency and new VTE and need for anticoagulation (status post IVC filter).  The patient is hemodynamically and clinically stable at this time.  He is much more  willing to consider procedures now that he has had time to receive all the information about everything that is going on in regards to his current clinical status.  We will plan to proceed with EGD/colonoscopy tomorrow, pending there is no contraindication from medicine or nephrology standpoint.  Will adjust his diet and place his preparation orders.  The risks and benefits of endoscopic evaluation were discussed with the patient; these include but are not limited to the risk of perforation, infection, bleeding, missed lesions, lack of diagnosis, severe illness requiring hospitalization, as well as anesthesia and sedation related illnesses.  The patient and/or family is agreeable to proceed.  All patient questions were answered to the best of my ability, and the patient agrees to the aforementioned plan of action  with follow-up as indicated.   PLAN/RECOMMENDATIONS  Clear liquid diet rest of today Plan for EGD/colonoscopy on 7/5 -Dulcolax 10 mg at 2 PM -Dulcolax 10 mg at 8 PM -1/2 Moviprep @500PM  -1/2 Moviprep @900PM    Please page/call with questions or concerns.   Corliss Parish, MD [facility] Office # [phone]    LOS: 2 days  Lemar Lofty  04/15/2023, 1:10 PM

## 2023-04-15 NOTE — Anesthesia Preprocedure Evaluation (Addendum)
Anesthesia Evaluation  Patient identified by MRN, date of birth, ID band Patient awake    Reviewed: Allergy & Precautions, NPO status , Patient's Chart, lab work & pertinent test results  History of Anesthesia Complications Negative for: history of anesthetic complications  Airway Mallampati: III  TM Distance: >3 FB Neck ROM: Full    Dental  (+) Edentulous Upper 8 teeth on the bottom. Denies them being loose.:   Pulmonary neg shortness of breath, neg sleep apnea, neg COPD, neg recent URI, Current Smoker and Patient abstained from smoking.   Pulmonary exam normal breath sounds clear to auscultation       Cardiovascular hypertension (amlodipine, HCTZ), Pt. on medications (-) angina + DVT  (-) Past MI, (-) Cardiac Stents and (-) CABG + dysrhythmias (PACs, prolonged QT)  Rhythm:Regular Rate:Normal  TTE 04/14/2023: IMPRESSIONS     1. Left ventricular ejection fraction, by estimation, is 60 to 65%. The  left ventricle has normal function. The left ventricle has no regional  wall motion abnormalities. There is moderate left ventricular hypertrophy.  Left ventricular diastolic  parameters were normal.   2. Right ventricular systolic function is normal. The right ventricular  size is normal. There is normal pulmonary artery systolic pressure. The  estimated right ventricular systolic pressure is 30.5 mmHg.   3. The mitral valve is normal in structure. Mild mitral valve  regurgitation.   4. The aortic valve is tricuspid. Aortic valve regurgitation is trivial.  Aortic valve sclerosis is present, with no evidence of aortic valve  stenosis.   5. The inferior vena cava is dilated in size with >50% respiratory  variability, suggesting right atrial pressure of 8 mmHg.     Neuro/Psych  Headaches, neg Seizures PSYCHIATRIC DISORDERS Anxiety      Neuromuscular disease (lumbar radiculoptahy)    GI/Hepatic Bowel prep,GERD  Medicated,,(+)      substance abuse  alcohol use  Endo/Other  negative endocrine ROS    Renal/GU Renal Insufficiency and ARFRenal disease     Musculoskeletal   Abdominal   Peds  Hematology  (+) Blood dyscrasia, anemia   Anesthesia Other Findings 67 y.o.  male with HTN, CKD stage IIIa-who presented with weakness/malaise, poor oral intake, LLE pain, maroon-colored stools--found to have lower GI bleeding with acute blood loss anemia, AKI, LLE DVT. Received 2 units pRBCs 7/3.  IVC filter placed by IR 7/3  Hgb 8.8  Reproductive/Obstetrics                             Anesthesia Physical Anesthesia Plan  ASA: 3  Anesthesia Plan: MAC   Post-op Pain Management:    Induction: Intravenous  PONV Risk Score and Plan: 0 and Propofol infusion and Treatment may vary due to age or medical condition  Airway Management Planned: Natural Airway and Nasal Cannula  Additional Equipment:   Intra-op Plan:   Post-operative Plan:   Informed Consent: I have reviewed the patients History and Physical, chart, labs and discussed the procedure including the risks, benefits and alternatives for the proposed anesthesia with the patient or authorized representative who has indicated his/her understanding and acceptance.     Dental advisory given  Plan Discussed with: Anesthesiologist and CRNA  Anesthesia Plan Comments: (Discussed with patient risks of MAC including, but not limited to, minor pain or discomfort, hearing people in the room, and possible need for backup general anesthesia. Risks for general anesthesia also discussed including, but not limited to, sore  throat, hoarse voice, chipped/damaged teeth, injury to vocal cords, nausea and vomiting, allergic reactions, lung infection, heart attack, stroke, and death. All questions answered. )       Anesthesia Quick Evaluation

## 2023-04-15 NOTE — H&P (View-Only) (Signed)
 Gastroenterology Inpatient Follow-up Note   PATIENT IDENTIFICATION  Timothy Chambers is a 67 y.o. male  Hospital Day: 3  SUBJECTIVE  Patient's chart has been reviewed. The patient's labs have been reviewed. Patient is amenable now for furth right er therapies now that he has a better understanding of why he is in the hospital. The patient denies fevers or chills.   The patient denies progressive abdominal pain or discomfort currently. He states that he had another dark bowel movement last night into this morning.     OBJECTIVE  Scheduled Inpatient Medications:   sodium chloride   Intravenous Once   amLODipine  5 mg Oral Daily   feeding supplement  237 mL Oral BID BM   lidocaine  20 mL Intradermal Once   lidocaine  20 mL Infiltration Once   [START ON 04/16/2023] pantoprazole  40 mg Intravenous Q12H   Continuous Inpatient Infusions:   pantoprazole 8 mg/hr (04/15/23 1215)   sodium bicarbonate 150 mEq in sterile water 1,150 mL infusion 75 mL/hr at 04/15/23 1020   PRN Inpatient Medications: acetaminophen **OR** acetaminophen, hydrALAZINE, HYDROmorphone (DILAUDID) injection, mouth rinse   Physical Examination  Temp:  [98 F (36.7 C)-98.9 F (37.2 C)] 98 F (36.7 C) (07/04 1224) Pulse Rate:  [89-102] 101 (07/04 1224) Resp:  [11-20] 17 (07/04 1224) BP: (144-188)/(78-90) 188/90 (07/04 1224) SpO2:  [94 %-96 %] 94 % (07/04 1224) Temp (24hrs), Avg:98.4 F (36.9 C), Min:98 F (36.7 C), Max:98.9 F (37.2 C)  Weight: 59 kg GEN: NAD, appears stated age, doesn't appear chronically ill PSYCH: Cooperative, without pressured speech EYE: Conjunctivae pink, sclerae anicteric ENT: MMM CV: Nontachycardic RESP: No audible wheezing GI: NABS, soft, NT/ND, without rebound MSK/EXT: No significant lower extremity edema SKIN: No jaundice NEURO:  Alert & Oriented x 3, no focal deficits   Review of Data   Laboratory Studies   Recent Labs  Lab 04/15/23 0155  NA 141  K 4.5  CL 117*   CO2 9*  BUN 42*  CREATININE 3.83*  GLUCOSE 156*  CALCIUM 7.8*  MG 2.3   Recent Labs  Lab 04/15/23 0155  AST 97*  ALT 57*  ALKPHOS 290*    Recent Labs  Lab 04/13/23 0810 04/14/23 0732 04/14/23 1805 04/15/23 0155  WBC 14.0* 12.5*  --  17.4*  HGB 6.5* 5.5*   < > 9.4*  HCT 19.3* 17.2*   < > 28.9*  PLT 526* 436*  --  449*   < > = values in this interval not displayed.   Recent Labs  Lab 04/13/23 0750  INR 1.0   Imaging Studies  ECHOCARDIOGRAM COMPLETE  Result Date: 04/14/2023    ECHOCARDIOGRAM REPORT   Patient Name:   Timothy Chambers Date of Exam: 04/14/2023 Medical Rec #:  [id number]       Height:       65.0 in Accession #:    [id number]      Weight:       130.0 lb Date of Birth:  01/25/1956       BSA:          1.647 m Patient Age:    67 years        BP:           160/78 mmHg Patient Gender: M               HR:           90 bpm. Exam Location:  Inpatient Procedure: 2D Echo, Cardiac   Doppler and Color Doppler Indications:    CHF  History:        Patient has no prior history of Echocardiogram examinations.                 Risk Factors:Hypertension.  Sonographer:    Samuel Clark Referring Phys: [id number] PING T ZHANG IMPRESSIONS  1. Left ventricular ejection fraction, by estimation, is 60 to 65%. The left ventricle has normal function. The left ventricle has no regional wall motion abnormalities. There is moderate left ventricular hypertrophy. Left ventricular diastolic parameters were normal.  2. Right ventricular systolic function is normal. The right ventricular size is normal. There is normal pulmonary artery systolic pressure. The estimated right ventricular systolic pressure is 30.5 mmHg.  3. The mitral valve is normal in structure. Mild mitral valve regurgitation.  4. The aortic valve is tricuspid. Aortic valve regurgitation is trivial. Aortic valve sclerosis is present, with no evidence of aortic valve stenosis.  5. The inferior vena cava is dilated in size with >50% respiratory  variability, suggesting right atrial pressure of 8 mmHg. FINDINGS  Left Ventricle: Left ventricular ejection fraction, by estimation, is 60 to 65%. The left ventricle has normal function. The left ventricle has no regional wall motion abnormalities. The left ventricular internal cavity size was normal in size. There is  moderate left ventricular hypertrophy. Left ventricular diastolic parameters were normal. Right Ventricle: The right ventricular size is normal. No increase in right ventricular wall thickness. Right ventricular systolic function is normal. There is normal pulmonary artery systolic pressure. The tricuspid regurgitant velocity is 2.37 m/s, and  with an assumed right atrial pressure of 8 mmHg, the estimated right ventricular systolic pressure is 30.5 mmHg. Left Atrium: Left atrial size was normal in size. Right Atrium: Right atrial size was normal in size. Pericardium: There is no evidence of pericardial effusion. Mitral Valve: The mitral valve is normal in structure. Mild mitral valve regurgitation. Tricuspid Valve: The tricuspid valve is normal in structure. Tricuspid valve regurgitation is trivial. Aortic Valve: The aortic valve is tricuspid. Aortic valve regurgitation is trivial. Aortic valve sclerosis is present, with no evidence of aortic valve stenosis. Aortic valve mean gradient measures 4.0 mmHg. Aortic valve peak gradient measures 7.0 mmHg. Aortic valve area, by VTI measures 2.52 cm. Pulmonic Valve: The pulmonic valve was not well visualized. Pulmonic valve regurgitation is trivial. Aorta: The aortic root and ascending aorta are structurally normal, with no evidence of dilitation. Venous: The inferior vena cava is dilated in size with greater than 50% respiratory variability, suggesting right atrial pressure of 8 mmHg. IAS/Shunts: The interatrial septum was not well visualized.  LEFT VENTRICLE PLAX 2D LVIDd:         4.30 cm   Diastology LVIDs:         3.20 cm   LV e' medial:    0.11 cm/s LV  PW:         1.30 cm   LV E/e' medial:  10.6 LV IVS:        1.30 cm   LV e' lateral:   0.09 cm/s LVOT diam:     1.90 cm   LV E/e' lateral: 12.9 LV SV:         61 LV SV Index:   37 LVOT Area:     2.84 cm  RIGHT VENTRICLE             IVC RV S prime:     11.90 cm/s  IVC diam: 1.80 cm   TAPSE (M-mode): 2.4 cm LEFT ATRIUM             Index        RIGHT ATRIUM           Index LA Vol (A2C):   59.9 ml 36.36 ml/m  RA Area:     10.50 cm LA Vol (A4C):   46.5 ml 28.23 ml/m  RA Volume:   20.60 ml  12.51 ml/m LA Biplane Vol: 52.8 ml 32.05 ml/m  AORTIC VALVE AV Area (Vmax):    2.62 cm AV Area (Vmean):   2.69 cm AV Area (VTI):     2.52 cm AV Vmax:           132.00 cm/s AV Vmean:          89.100 cm/s AV VTI:            0.243 m AV Peak Grad:      7.0 mmHg AV Mean Grad:      4.0 mmHg LVOT Vmax:         122.00 cm/s LVOT Vmean:        84.400 cm/s LVOT VTI:          0.216 m LVOT/AV VTI ratio: 0.89  AORTA Ao Root diam: 3.50 cm Ao Asc diam:  3.20 cm MITRAL VALVE                  TRICUSPID VALVE MV Area (PHT): 3.72 cm       TR Peak grad:   22.5 mmHg MV Decel Time: 204 msec       TR Vmax:        237.00 cm/s MR Peak grad:    131.3 mmHg MR Mean grad:    97.0 mmHg    SHUNTS MR Vmax:         573.00 cm/s  Systemic VTI:  0.22 m MR Vmean:        469.0 cm/s   Systemic Diam: 1.90 cm MR PISA:         4.02 cm MR PISA Eff ROA: 27 mm MR PISA Radius:  0.80 cm MV E velocity: 1.12 cm/s MV A velocity: 85.90 cm/s MV E/A ratio:  0.01 Christopher Schumann MD Electronically signed by Christopher Schumann MD Signature Date/Time: 04/14/2023/5:18:54 PM    Final     GI Procedures and Studies  No new procedures to review   ASSESSMENT  Mr. Petzold is a 67 y.o. male with a pmh significant for hypertension, CRI, diverticulosis.  Patient presented with significant weight loss, anemia and iron deficiency and new VTE and need for anticoagulation (status post IVC filter).  The patient is hemodynamically and clinically stable at this time.  He is much more  willing to consider procedures now that he has had time to receive all the information about everything that is going on in regards to his current clinical status.  We will plan to proceed with EGD/colonoscopy tomorrow, pending there is no contraindication from medicine or nephrology standpoint.  Will adjust his diet and place his preparation orders.  The risks and benefits of endoscopic evaluation were discussed with the patient; these include but are not limited to the risk of perforation, infection, bleeding, missed lesions, lack of diagnosis, severe illness requiring hospitalization, as well as anesthesia and sedation related illnesses.  The patient and/or family is agreeable to proceed.  All patient questions were answered to the best of my ability, and the patient agrees to the aforementioned plan of action   with follow-up as indicated.   PLAN/RECOMMENDATIONS  Clear liquid diet rest of today Plan for EGD/colonoscopy on 7/5 -Dulcolax 10 mg at 2 PM -Dulcolax 10 mg at 8 PM -1/2 Moviprep @500PM -1/2 Moviprep @900PM   Please page/call with questions or concerns.   Steward Sames Mansouraty, MD [facility] Office # [phone]    LOS: 2 days  Kalese Ensz Mansouraty Jr  04/15/2023, 1:10 PM  

## 2023-04-16 ENCOUNTER — Encounter (HOSPITAL_COMMUNITY): Payer: Self-pay | Admitting: Internal Medicine

## 2023-04-16 ENCOUNTER — Inpatient Hospital Stay (HOSPITAL_COMMUNITY): Payer: Medicare HMO | Admitting: Anesthesiology

## 2023-04-16 ENCOUNTER — Encounter (HOSPITAL_COMMUNITY)
Admission: EM | Disposition: A | Discharge status: Home / Self Care | Payer: Self-pay | Source: Home / Self Care | Attending: Internal Medicine

## 2023-04-16 DIAGNOSIS — K2990 Gastroduodenitis, unspecified, without bleeding: Secondary | ICD-10-CM

## 2023-04-16 DIAGNOSIS — I10 Essential (primary) hypertension: Secondary | ICD-10-CM | POA: Diagnosis not present

## 2023-04-16 DIAGNOSIS — K6389 Other specified diseases of intestine: Secondary | ICD-10-CM

## 2023-04-16 DIAGNOSIS — K2289 Other specified disease of esophagus: Secondary | ICD-10-CM | POA: Diagnosis not present

## 2023-04-16 DIAGNOSIS — E785 Hyperlipidemia, unspecified: Secondary | ICD-10-CM | POA: Diagnosis not present

## 2023-04-16 DIAGNOSIS — K641 Second degree hemorrhoids: Secondary | ICD-10-CM | POA: Diagnosis not present

## 2023-04-16 DIAGNOSIS — R600 Localized edema: Secondary | ICD-10-CM | POA: Diagnosis not present

## 2023-04-16 DIAGNOSIS — D125 Benign neoplasm of sigmoid colon: Secondary | ICD-10-CM | POA: Diagnosis not present

## 2023-04-16 DIAGNOSIS — N179 Acute kidney failure, unspecified: Secondary | ICD-10-CM | POA: Diagnosis not present

## 2023-04-16 DIAGNOSIS — D126 Benign neoplasm of colon, unspecified: Secondary | ICD-10-CM

## 2023-04-16 DIAGNOSIS — K209 Esophagitis, unspecified without bleeding: Secondary | ICD-10-CM | POA: Diagnosis not present

## 2023-04-16 DIAGNOSIS — E876 Hypokalemia: Secondary | ICD-10-CM | POA: Diagnosis not present

## 2023-04-16 DIAGNOSIS — K921 Melena: Secondary | ICD-10-CM | POA: Diagnosis not present

## 2023-04-16 DIAGNOSIS — K922 Gastrointestinal hemorrhage, unspecified: Secondary | ICD-10-CM | POA: Diagnosis not present

## 2023-04-16 DIAGNOSIS — D124 Benign neoplasm of descending colon: Secondary | ICD-10-CM

## 2023-04-16 DIAGNOSIS — D509 Iron deficiency anemia, unspecified: Secondary | ICD-10-CM | POA: Diagnosis not present

## 2023-04-16 DIAGNOSIS — I82452 Acute embolism and thrombosis of left peroneal vein: Secondary | ICD-10-CM | POA: Diagnosis not present

## 2023-04-16 HISTORY — PX: ESOPHAGOGASTRODUODENOSCOPY: SHX5428

## 2023-04-16 HISTORY — PX: COLONOSCOPY WITH PROPOFOL: SHX5780

## 2023-04-16 HISTORY — PX: BIOPSY: SHX5522

## 2023-04-16 HISTORY — PX: POLYPECTOMY: SHX5525

## 2023-04-16 LAB — COMPREHENSIVE METABOLIC PANEL
ALT: 45 U/L — ABNORMAL HIGH (ref 0–44)
AST: 57 U/L — ABNORMAL HIGH (ref 15–41)
Albumin: 1.9 g/dL — ABNORMAL LOW (ref 3.5–5.0)
Alkaline Phosphatase: 286 U/L — ABNORMAL HIGH (ref 38–126)
Anion gap: 11 (ref 5–15)
BUN: 32 mg/dL — ABNORMAL HIGH (ref 8–23)
CO2: 16 mmol/L — ABNORMAL LOW (ref 22–32)
Calcium: 7.7 mg/dL — ABNORMAL LOW (ref 8.9–10.3)
Chloride: 114 mmol/L — ABNORMAL HIGH (ref 98–111)
Creatinine, Ser: 3.54 mg/dL — ABNORMAL HIGH (ref 0.61–1.24)
GFR, Estimated: 18 mL/min — ABNORMAL LOW (ref >60)
Glucose, Bld: 91 mg/dL (ref 70–99)
Potassium: 3.3 mmol/L — ABNORMAL LOW (ref 3.5–5.1)
Sodium: 141 mmol/L (ref 135–145)
Total Bilirubin: 0.8 mg/dL (ref 0.3–1.2)
Total Protein: 5.1 g/dL — ABNORMAL LOW (ref 6.5–8.1)

## 2023-04-16 LAB — CBC WITH DIFFERENTIAL/PLATELET
Abs Immature Granulocytes: 0.07 10*3/uL (ref 0.00–0.07)
Basophils Absolute: 0 10*3/uL (ref 0.0–0.1)
Basophils Relative: 0 %
Eosinophils Absolute: 0.1 10*3/uL (ref 0.0–0.5)
Eosinophils Relative: 1 %
HCT: 25.9 % — ABNORMAL LOW (ref 39.0–52.0)
Hemoglobin: 8.8 g/dL — ABNORMAL LOW (ref 13.0–17.0)
Immature Granulocytes: 1 %
Lymphocytes Relative: 6 %
Lymphs Abs: 0.7 10*3/uL (ref 0.7–4.0)
MCH: 31.4 pg (ref 26.0–34.0)
MCHC: 34 g/dL (ref 30.0–36.0)
MCV: 92.5 fL (ref 80.0–100.0)
Monocytes Absolute: 0.9 10*3/uL (ref 0.1–1.0)
Monocytes Relative: 8 %
Neutro Abs: 9.4 10*3/uL — ABNORMAL HIGH (ref 1.7–7.7)
Neutrophils Relative %: 84 %
Platelets: 399 10*3/uL (ref 150–400)
RBC: 2.8 MIL/uL — ABNORMAL LOW (ref 4.22–5.81)
RDW: 15.9 % — ABNORMAL HIGH (ref 11.5–15.5)
WBC: 11.1 10*3/uL — ABNORMAL HIGH (ref 4.0–10.5)
nRBC: 0 % (ref 0.0–0.2)

## 2023-04-16 LAB — IMMUNOFIXATION, URINE

## 2023-04-16 LAB — PROTIME-INR
INR: 1.1 (ref 0.8–1.2)
Prothrombin Time: 14.3 seconds (ref 11.4–15.2)

## 2023-04-16 SURGERY — EGD (ESOPHAGOGASTRODUODENOSCOPY)
Anesthesia: Monitor Anesthesia Care

## 2023-04-16 MED ORDER — POTASSIUM CHLORIDE 10 MEQ/100ML IV SOLN
10.0000 meq | Freq: Once | INTRAVENOUS | Status: AC
  Administered 2023-04-16: 10 meq via INTRAVENOUS
  Filled 2023-04-16: qty 100

## 2023-04-16 MED ORDER — ONDANSETRON HCL 4 MG/2ML IJ SOLN
4.0000 mg | Freq: Once | INTRAMUSCULAR | Status: AC
  Administered 2023-04-16: 4 mg via INTRAVENOUS

## 2023-04-16 MED ORDER — CARVEDILOL 6.25 MG PO TABS
6.2500 mg | ORAL_TABLET | Freq: Two times a day (BID) | ORAL | Status: DC
  Administered 2023-04-16: 6.25 mg via ORAL
  Filled 2023-04-16: qty 1

## 2023-04-16 MED ORDER — LACTATED RINGERS IV SOLN: INTRAVENOUS | Status: DC | PRN

## 2023-04-16 MED ORDER — SUCRALFATE 1 G PO TABS
1.0000 g | ORAL_TABLET | Freq: Two times a day (BID) | ORAL | Status: DC
  Administered 2023-04-16 (×2): 1 g via ORAL
  Filled 2023-04-16 (×2): qty 1

## 2023-04-16 MED ORDER — ONDANSETRON HCL 4 MG/2ML IJ SOLN
INTRAMUSCULAR | Status: AC
  Filled 2023-04-16: qty 4

## 2023-04-16 MED ORDER — SODIUM CHLORIDE 0.9 % IV SOLN: INTRAVENOUS | Status: DC

## 2023-04-16 MED ORDER — PROPOFOL 500 MG/50ML IV EMUL
INTRAVENOUS | Status: DC | PRN
  Administered 2023-04-16: 100 ug/kg/min via INTRAVENOUS

## 2023-04-16 MED ORDER — PANTOPRAZOLE SODIUM 40 MG PO TBEC
40.0000 mg | DELAYED_RELEASE_TABLET | Freq: Two times a day (BID) | ORAL | Status: DC
  Administered 2023-04-16 (×2): 40 mg via ORAL
  Filled 2023-04-16 (×2): qty 1

## 2023-04-16 MED ORDER — LIDOCAINE 2% (20 MG/ML) 5 ML SYRINGE
INTRAMUSCULAR | Status: DC | PRN
  Administered 2023-04-16: 100 mg via INTRAVENOUS

## 2023-04-16 MED ORDER — POTASSIUM CHLORIDE 10 MEQ/100ML IV SOLN
10.0000 meq | INTRAVENOUS | Status: AC
  Administered 2023-04-16: 10 meq via INTRAVENOUS
  Filled 2023-04-16: qty 100

## 2023-04-16 MED ORDER — PROPOFOL 10 MG/ML IV BOLUS
INTRAVENOUS | Status: DC | PRN
  Administered 2023-04-16: 50 mg via INTRAVENOUS

## 2023-04-16 SURGICAL SUPPLY — 22 items

## 2023-04-16 NOTE — Care Management Important Message (Signed)
Important Message  Patient Details  Name: Timothy Chambers MRN: [id number] Date of Birth: 1955-11-07   Medicare Important Message Given:  Yes     Dorena Bodo 04/16/2023, 3:25 PM

## 2023-04-16 NOTE — Progress Notes (Signed)
PROGRESS NOTE        PATIENT DETAILS Name: Timothy Chambers Age: 67 y.o. Sex: male Date of Birth: 1956-09-24 Admit Date: 04/13/2023 Admitting Physician Emeline General, MD ZOX:WRUEAVW, [name], MD  Brief Summary: Patient is a 67 y.o.  male with HTN, CKD stage IIIa-who presented with weakness/malaise, poor oral intake, LLE pain, maroon-colored stools--found to have lower GI bleeding with acute blood loss anemia, AKI, LLE DVT.  Significant events: 7/2>> admit to [facility]  Significant studies: 7/2>> CT abdomen/pelvis: No acute findings.  No hydronephrosis.  Diverticulosis. 7/2>> CXR: No PNA 7/2>> LLE Doppler: Left lower extremity DVT.  Significant microbiology data: None  Procedures: 7/3>> IVC filter placement by IR  Consults: Pathology GI IR.  Subjective: Hungry-asking for diet-for colonoscopy/endoscopy today-no complaints-no major issues overnight.  Objective: Vitals: Blood pressure (!) 142/77, pulse 88, temperature 98.4 F (36.9 C), temperature source Temporal, resp. rate 17, height 5\' 5"  (1.651 m), weight 59 kg, SpO2 96 %.   Exam: Gen Exam:Alert awake-not in any distress HEENT:atraumatic, normocephalic Chest: B/L clear to auscultation anteriorly CVS:S1S2 regular Abdomen:soft non tender, non distended Extremities:no edema Neurology: Non focal Skin: no rash  Pertinent Labs/Radiology:    Latest Ref Rng & Units 04/16/2023    4:19 AM 04/15/2023    1:55 AM 04/14/2023    6:05 PM  CBC  WBC 4.0 - 10.5 K/uL 11.1  17.4    Hemoglobin 13.0 - 17.0 g/dL 8.8  9.4  8.5   Hematocrit 39.0 - 52.0 % 25.9  28.9  25.9   Platelets 150 - 400 K/uL 399  449      Lab Results  Component Value Date   NA 141 04/16/2023   K 3.3 (L) 04/16/2023   CL 114 (H) 04/16/2023   CO2 16 (L) 04/16/2023      Assessment/Plan: AKI on CKD stage IIIa Hemodynamically mediated in the setting of GI bleeding/NSAID use UA with some protein-CT abdomen without and hydronephrosis Renal  function gradually improving  autoimmune panel negative so far Multiple myeloma workup pending Avoid nephrotoxic agents-continue supportive care and follow renal function. Nephrology following.  GI bleeding (maroon-colored stools seen on 7/2 at med center [facility]) with acute blood loss anemia No further GI bleeding since admission on 7/2  Hb now stable after 2 units of PRBC transfusion on 7/3  Endoscopy/colonoscopy scheduled 7/5 Continue PPI and follow CBC   Multifactorial anemia Suspect anemia as not just from ABLA-suspect some amount of anemia from underlying CKD. Hb stable after 2 units of PRBC on 7/3  LLE DVT Unable to anticoagulate IR placed IVC filter on 7/3  Hypokalemia/hypomagnesemia Continue to replete-follow  Nonanion gap metabolic acidosis Secondary to CKD/AKI Bicarb better with isotonic bicarb infusion Follow electrolytes  HTN BP on the higher side-amlodipine dosage increased 7/5.    Weight loss/cachexia Apparently unintentional TSH stable HIV negative Concern for malignancy-colonoscopy/endoscopy scheduled 7/5-CT imaging negative for malignancy. Nutrition evaluation  Medical noncompliance Initially refused blood work/PRBC etc.-after extensive discussion on 7/3-after being made aware of life-threatening/life disabling effects of refusal of treatment-he is now much more cooperative.  Agreed for PRBC transfusion and underwent IVC filter on 7/3-agreeable to pursue endoscopy evaluation to rule out malignancy on 7/5.    Debility/deconditioning PT/OT eval  BMI: Estimated body mass index is 21.63 kg/m as calculated from the following:   Height as of this encounter: 5\' 5"  (  1.651 m).   Weight as of this encounter: 59 kg.   Code status:   Code Status: Full Code   DVT Prophylaxis:SCD's   Family Communication: None at bedside  Disposition Plan: Status is: Inpatient Remains inpatient appropriate because: Severity of illness   Planned Discharge  Destination:Home   Diet: Diet Order             Diet NPO time specified  Diet effective now                     Antimicrobial agents: Anti-infectives (From admission, onward)    None        MEDICATIONS: Scheduled Meds:  [MAR Hold] sodium chloride   Intravenous Once   [MAR Hold] amLODipine  10 mg Oral Daily   [MAR Hold] bisacodyl  10 mg Oral Daily   [MAR Hold] feeding supplement  237 mL Oral BID BM   [MAR Hold] lidocaine  20 mL Intradermal Once   [MAR Hold] lidocaine  20 mL Infiltration Once   [MAR Hold] pantoprazole  40 mg Intravenous Q12H   Continuous Infusions:  sodium chloride     sodium bicarbonate 150 mEq in sterile water 1,150 mL infusion 75 mL/hr at 04/15/23 1519   PRN Meds:.[MAR Hold] acetaminophen **OR** [MAR Hold] acetaminophen, [MAR Hold] hydrALAZINE, [MAR Hold]  HYDROmorphone (DILAUDID) injection, [MAR Hold] mouth rinse   I have personally reviewed following labs and imaging studies  LABORATORY DATA: CBC: Recent Labs  Lab 04/13/23 0748 04/13/23 0810 04/14/23 0732 04/14/23 1805 04/15/23 0155 04/16/23 0419  WBC 12.1* 14.0* 12.5*  --  17.4* 11.1*  NEUTROABS 10.0* 11.4*  --   --   --  9.4*  HGB 6.3* 6.5* 5.5* 8.5* 9.4* 8.8*  HCT 18.9* 19.3* 17.2* 25.9* 28.9* 25.9*  MCV 94.5 95.5 97.2  --  96.0 92.5  PLT 531* 526* 436*  --  449* 399     Basic Metabolic Panel: Recent Labs  Lab 04/13/23 0748 04/14/23 0732 04/15/23 0155 04/16/23 0419  NA 140 140 141 141  K 2.6* 3.4* 4.5 3.3*  CL 111 118* 117* 114*  CO2 16* 13* 9* 16*  GLUCOSE 103* 98 156* 91  BUN 57* 44* 42* 32*  CREATININE 4.28* 3.90* 3.83* 3.54*  CALCIUM 7.6* 7.2* 7.8* 7.7*  MG 1.0* 1.3* 2.3  --      GFR: Estimated Creatinine Clearance: 17.4 mL/min (A) (by C-G formula based on SCr of 3.54 mg/dL (H)).  Liver Function Tests: Recent Labs  Lab 04/13/23 0748 04/15/23 0155 04/16/23 0419  AST 34 97* 57*  ALT 23 57* 45*  ALKPHOS 125 290* 286*  BILITOT 0.6 0.7 0.8  PROT 6.4*  5.7* 5.1*  ALBUMIN 2.6* 2.1* 1.9*    No results for input(s): "LIPASE", "AMYLASE" in the last 168 hours. No results for input(s): "AMMONIA" in the last 168 hours.  Coagulation Profile: Recent Labs  Lab 04/13/23 0750 04/16/23 0419  INR 1.0 1.1     Cardiac Enzymes: No results for input(s): "CKTOTAL", "CKMB", "CKMBINDEX", "TROPONINI" in the last 168 hours.  BNP (last 3 results) No results for input(s): "PROBNP" in the last 8760 hours.  Lipid Profile: No results for input(s): "CHOL", "HDL", "LDLCALC", "TRIG", "CHOLHDL", "LDLDIRECT" in the last 72 hours.  Thyroid Function Tests: No results for input(s): "TSH", "T4TOTAL", "FREET4", "T3FREE", "THYROIDAB" in the last 72 hours.   Anemia Panel: No results for input(s): "VITAMINB12", "FOLATE", "FERRITIN", "TIBC", "IRON", "RETICCTPCT" in the last 72 hours.   Urine analysis:  Component Value Date/Time   COLORURINE YELLOW 04/13/2023 0818   APPEARANCEUR CLEAR 04/13/2023 0818   LABSPEC 1.015 04/13/2023 0818   PHURINE 5.5 04/13/2023 0818   GLUCOSEU NEGATIVE 04/13/2023 0818   HGBUR SMALL (A) 04/13/2023 0818   BILIRUBINUR NEGATIVE 04/13/2023 0818   KETONESUR NEGATIVE 04/13/2023 0818   PROTEINUR 100 (A) 04/13/2023 0818   UROBILINOGEN 0.2 03/10/2013 1153   NITRITE NEGATIVE 04/13/2023 0818   LEUKOCYTESUR NEGATIVE 04/13/2023 0818    Sepsis Labs: Lactic Acid, Venous No results found for: "LATICACIDVEN"  MICROBIOLOGY: No results found for this or any previous visit (from the past 240 hour(s)).  RADIOLOGY STUDIES/RESULTS: ECHOCARDIOGRAM COMPLETE  Result Date: 04/14/2023    ECHOCARDIOGRAM REPORT   Patient Name:   RAYSHAN RAVENSCRAFT Date of Exam: 04/14/2023 Medical Rec #:  [id number]       Height:       65.0 in Accession #:    [id number]      Weight:       130.0 lb Date of Birth:  1956-02-04       BSA:          1.647 m Patient Age:    67 years        BP:           160/78 mmHg Patient Gender: M               HR:           90 bpm. Exam  Location:  Inpatient Procedure: 2D Echo, Cardiac Doppler and Color Doppler Indications:    CHF  History:        Patient has no prior history of Echocardiogram examinations.                 Risk Factors:Hypertension.  Sonographer:    Darlys Gales Referring Phys: [id number] PING T ZHANG IMPRESSIONS  1. Left ventricular ejection fraction, by estimation, is 60 to 65%. The left ventricle has normal function. The left ventricle has no regional wall motion abnormalities. There is moderate left ventricular hypertrophy. Left ventricular diastolic parameters were normal.  2. Right ventricular systolic function is normal. The right ventricular size is normal. There is normal pulmonary artery systolic pressure. The estimated right ventricular systolic pressure is 30.5 mmHg.  3. The mitral valve is normal in structure. Mild mitral valve regurgitation.  4. The aortic valve is tricuspid. Aortic valve regurgitation is trivial. Aortic valve sclerosis is present, with no evidence of aortic valve stenosis.  5. The inferior vena cava is dilated in size with >50% respiratory variability, suggesting right atrial pressure of 8 mmHg. FINDINGS  Left Ventricle: Left ventricular ejection fraction, by estimation, is 60 to 65%. The left ventricle has normal function. The left ventricle has no regional wall motion abnormalities. The left ventricular internal cavity size was normal in size. There is  moderate left ventricular hypertrophy. Left ventricular diastolic parameters were normal. Right Ventricle: The right ventricular size is normal. No increase in right ventricular wall thickness. Right ventricular systolic function is normal. There is normal pulmonary artery systolic pressure. The tricuspid regurgitant velocity is 2.37 m/s, and  with an assumed right atrial pressure of 8 mmHg, the estimated right ventricular systolic pressure is 30.5 mmHg. Left Atrium: Left atrial size was normal in size. Right Atrium: Right atrial size was normal in  size. Pericardium: There is no evidence of pericardial effusion. Mitral Valve: The mitral valve is normal in structure. Mild mitral valve regurgitation. Tricuspid Valve: The tricuspid valve is normal  in structure. Tricuspid valve regurgitation is trivial. Aortic Valve: The aortic valve is tricuspid. Aortic valve regurgitation is trivial. Aortic valve sclerosis is present, with no evidence of aortic valve stenosis. Aortic valve mean gradient measures 4.0 mmHg. Aortic valve peak gradient measures 7.0 mmHg. Aortic valve area, by VTI measures 2.52 cm. Pulmonic Valve: The pulmonic valve was not well visualized. Pulmonic valve regurgitation is trivial. Aorta: The aortic root and ascending aorta are structurally normal, with no evidence of dilitation. Venous: The inferior vena cava is dilated in size with greater than 50% respiratory variability, suggesting right atrial pressure of 8 mmHg. IAS/Shunts: The interatrial septum was not well visualized.  LEFT VENTRICLE PLAX 2D LVIDd:         4.30 cm   Diastology LVIDs:         3.20 cm   LV e' medial:    0.11 cm/s LV PW:         1.30 cm   LV E/e' medial:  10.6 LV IVS:        1.30 cm   LV e' lateral:   0.09 cm/s LVOT diam:     1.90 cm   LV E/e' lateral: 12.9 LV SV:         61 LV SV Index:   37 LVOT Area:     2.84 cm  RIGHT VENTRICLE             IVC RV S prime:     11.90 cm/s  IVC diam: 1.80 cm TAPSE (M-mode): 2.4 cm LEFT ATRIUM             Index        RIGHT ATRIUM           Index LA Vol (A2C):   59.9 ml 36.36 ml/m  RA Area:     10.50 cm LA Vol (A4C):   46.5 ml 28.23 ml/m  RA Volume:   20.60 ml  12.51 ml/m LA Biplane Vol: 52.8 ml 32.05 ml/m  AORTIC VALVE AV Area (Vmax):    2.62 cm AV Area (Vmean):   2.69 cm AV Area (VTI):     2.52 cm AV Vmax:           132.00 cm/s AV Vmean:          89.100 cm/s AV VTI:            0.243 m AV Peak Grad:      7.0 mmHg AV Mean Grad:      4.0 mmHg LVOT Vmax:         122.00 cm/s LVOT Vmean:        84.400 cm/s LVOT VTI:          0.216 m  LVOT/AV VTI ratio: 0.89  AORTA Ao Root diam: 3.50 cm Ao Asc diam:  3.20 cm MITRAL VALVE                  TRICUSPID VALVE MV Area (PHT): 3.72 cm       TR Peak grad:   22.5 mmHg MV Decel Time: 204 msec       TR Vmax:        237.00 cm/s MR Peak grad:    131.3 mmHg MR Mean grad:    97.0 mmHg    SHUNTS MR Vmax:         573.00 cm/s  Systemic VTI:  0.22 m MR Vmean:        469.0 cm/s   Systemic Diam:  1.90 cm MR PISA:         4.02 cm MR PISA Eff ROA: 27 mm MR PISA Radius:  0.80 cm MV E velocity: 1.12 cm/s MV A velocity: 85.90 cm/s MV E/A ratio:  0.01 Epifanio Lesches MD Electronically signed by Epifanio Lesches MD Signature Date/Time: 04/14/2023/5:18:54 PM    Final      LOS: 3 days   Jeoffrey Massed, MD  [facility]    To contact the attending provider between 7A-7P or the covering provider during after hours 7P-7A, please log into the web site [email or web address] and access using universal [place] password for that web site. If you do not have the password, please call the hospital operator.  04/16/2023, 11:30 AM

## 2023-04-16 NOTE — Transfer of Care (Signed)
Immediate Anesthesia Transfer of Care Note  Patient: Timothy Chambers  Procedure(s) Performed: ESOPHAGOGASTRODUODENOSCOPY (EGD) COLONOSCOPY WITH PROPOFOL BIOPSY POLYPECTOMY  Patient Location: Endoscopy Unit  Anesthesia Type:MAC  Level of Consciousness: drowsy  Airway & Oxygen Therapy: Patient Spontanous Breathing and Patient connected to nasal cannula oxygen  Post-op Assessment: Report given to RN and Post -op Vital signs reviewed and stable  Post vital signs: Reviewed and stable  Last Vitals:  Vitals Value Taken Time  BP 183/93 04/16/23 1145  Temp    Pulse 97 04/16/23 1146  Resp 17 04/16/23 1146  SpO2 95 % 04/16/23 1146  Vitals shown include unvalidated device data.  Last Pain:  Vitals:   04/16/23 1121  TempSrc:   PainSc: 0-No pain         Complications: No notable events documented.

## 2023-04-16 NOTE — Progress Notes (Signed)
Patient ID: Timothy Chambers, male   DOB: 11-03-1955, 67 y.o.   MRN: [id number] S: Just came back from Endo suite.  No new complaints.  O:BP (!) 217/93 (BP Location: Left Wrist)   Pulse (!) 102   Temp 98.5 F (36.9 C) (Oral)   Resp 17   Ht 5\' 5"  (1.651 m)   Wt 59 kg   SpO2 96%   BMI 21.63 kg/m   Intake/Output Summary (Last 24 hours) at 04/16/2023 1241 Last data filed at 04/16/2023 1233 Gross per 24 hour  Intake 1301.33 ml  Output 1050 ml  Net 251.33 ml   Intake/Output: I/O last 3 completed shifts: In: 717.5 [P.O.:120; I.V.:597.5] Out: 1550 [Urine:1550]  Intake/Output this shift:  Total I/O In: 900 [I.V.:900] Out: 400 [Urine:400] Weight change:  Gen: NAD CVS: tachy at 102 Resp:CTA Abd: +BS, soft, NT/ND Ext: no edema  Recent Labs  Lab 04/13/23 0748 04/14/23 0732 04/15/23 0155 04/16/23 0419  NA 140 140 141 141  K 2.6* 3.4* 4.5 3.3*  CL 111 118* 117* 114*  CO2 16* 13* 9* 16*  GLUCOSE 103* 98 156* 91  BUN 57* 44* 42* 32*  CREATININE 4.28* 3.90* 3.83* 3.54*  ALBUMIN 2.6*  --  2.1* 1.9*  CALCIUM 7.6* 7.2* 7.8* 7.7*  AST 34  --  97* 57*  ALT 23  --  57* 45*   Liver Function Tests: Recent Labs  Lab 04/13/23 0748 04/15/23 0155 04/16/23 0419  AST 34 97* 57*  ALT 23 57* 45*  ALKPHOS 125 290* 286*  BILITOT 0.6 0.7 0.8  PROT 6.4* 5.7* 5.1*  ALBUMIN 2.6* 2.1* 1.9*   No results for input(s): "LIPASE", "AMYLASE" in the last 168 hours. No results for input(s): "AMMONIA" in the last 168 hours. CBC: Recent Labs  Lab 04/13/23 0748 04/13/23 0810 04/14/23 0732 04/14/23 1805 04/15/23 0155 04/16/23 0419  WBC 12.1* 14.0* 12.5*  --  17.4* 11.1*  NEUTROABS 10.0* 11.4*  --   --   --  9.4*  HGB 6.3* 6.5* 5.5* 8.5* 9.4* 8.8*  HCT 18.9* 19.3* 17.2* 25.9* 28.9* 25.9*  MCV 94.5 95.5 97.2  --  96.0 92.5  PLT 531* 526* 436*  --  449* 399   Cardiac Enzymes: No results for input(s): "CKTOTAL", "CKMB", "CKMBINDEX", "TROPONINI" in the last 168 hours. CBG: No results for  input(s): "GLUCAP" in the last 168 hours.  Iron Studies: No results for input(s): "IRON", "TIBC", "TRANSFERRIN", "FERRITIN" in the last 72 hours. Studies/Results: ECHOCARDIOGRAM COMPLETE  Result Date: 04/14/2023    ECHOCARDIOGRAM REPORT   Patient Name:   Timothy Chambers Date of Exam: 04/14/2023 Medical Rec #:  [id number]       Height:       65.0 in Accession #:    [id number]      Weight:       130.0 lb Date of Birth:  31-Jan-1956       BSA:          1.647 m Patient Age:    67 years        BP:           160/78 mmHg Patient Gender: M               HR:           90 bpm. Exam Location:  Inpatient Procedure: 2D Echo, Cardiac Doppler and Color Doppler Indications:    CHF  History:        Patient has no prior history of  Echocardiogram examinations.                 Risk Factors:Hypertension.  Sonographer:    Darlys Gales Referring Phys: [id number] PING T ZHANG IMPRESSIONS  1. Left ventricular ejection fraction, by estimation, is 60 to 65%. The left ventricle has normal function. The left ventricle has no regional wall motion abnormalities. There is moderate left ventricular hypertrophy. Left ventricular diastolic parameters were normal.  2. Right ventricular systolic function is normal. The right ventricular size is normal. There is normal pulmonary artery systolic pressure. The estimated right ventricular systolic pressure is 30.5 mmHg.  3. The mitral valve is normal in structure. Mild mitral valve regurgitation.  4. The aortic valve is tricuspid. Aortic valve regurgitation is trivial. Aortic valve sclerosis is present, with no evidence of aortic valve stenosis.  5. The inferior vena cava is dilated in size with >50% respiratory variability, suggesting right atrial pressure of 8 mmHg. FINDINGS  Left Ventricle: Left ventricular ejection fraction, by estimation, is 60 to 65%. The left ventricle has normal function. The left ventricle has no regional wall motion abnormalities. The left ventricular internal cavity size was  normal in size. There is  moderate left ventricular hypertrophy. Left ventricular diastolic parameters were normal. Right Ventricle: The right ventricular size is normal. No increase in right ventricular wall thickness. Right ventricular systolic function is normal. There is normal pulmonary artery systolic pressure. The tricuspid regurgitant velocity is 2.37 m/s, and  with an assumed right atrial pressure of 8 mmHg, the estimated right ventricular systolic pressure is 30.5 mmHg. Left Atrium: Left atrial size was normal in size. Right Atrium: Right atrial size was normal in size. Pericardium: There is no evidence of pericardial effusion. Mitral Valve: The mitral valve is normal in structure. Mild mitral valve regurgitation. Tricuspid Valve: The tricuspid valve is normal in structure. Tricuspid valve regurgitation is trivial. Aortic Valve: The aortic valve is tricuspid. Aortic valve regurgitation is trivial. Aortic valve sclerosis is present, with no evidence of aortic valve stenosis. Aortic valve mean gradient measures 4.0 mmHg. Aortic valve peak gradient measures 7.0 mmHg. Aortic valve area, by VTI measures 2.52 cm. Pulmonic Valve: The pulmonic valve was not well visualized. Pulmonic valve regurgitation is trivial. Aorta: The aortic root and ascending aorta are structurally normal, with no evidence of dilitation. Venous: The inferior vena cava is dilated in size with greater than 50% respiratory variability, suggesting right atrial pressure of 8 mmHg. IAS/Shunts: The interatrial septum was not well visualized.  LEFT VENTRICLE PLAX 2D LVIDd:         4.30 cm   Diastology LVIDs:         3.20 cm   LV e' medial:    0.11 cm/s LV PW:         1.30 cm   LV E/e' medial:  10.6 LV IVS:        1.30 cm   LV e' lateral:   0.09 cm/s LVOT diam:     1.90 cm   LV E/e' lateral: 12.9 LV SV:         61 LV SV Index:   37 LVOT Area:     2.84 cm  RIGHT VENTRICLE             IVC RV S prime:     11.90 cm/s  IVC diam: 1.80 cm TAPSE  (M-mode): 2.4 cm LEFT ATRIUM             Index  RIGHT ATRIUM           Index LA Vol (A2C):   59.9 ml 36.36 ml/m  RA Area:     10.50 cm LA Vol (A4C):   46.5 ml 28.23 ml/m  RA Volume:   20.60 ml  12.51 ml/m LA Biplane Vol: 52.8 ml 32.05 ml/m  AORTIC VALVE AV Area (Vmax):    2.62 cm AV Area (Vmean):   2.69 cm AV Area (VTI):     2.52 cm AV Vmax:           132.00 cm/s AV Vmean:          89.100 cm/s AV VTI:            0.243 m AV Peak Grad:      7.0 mmHg AV Mean Grad:      4.0 mmHg LVOT Vmax:         122.00 cm/s LVOT Vmean:        84.400 cm/s LVOT VTI:          0.216 m LVOT/AV VTI ratio: 0.89  AORTA Ao Root diam: 3.50 cm Ao Asc diam:  3.20 cm MITRAL VALVE                  TRICUSPID VALVE MV Area (PHT): 3.72 cm       TR Peak grad:   22.5 mmHg MV Decel Time: 204 msec       TR Vmax:        237.00 cm/s MR Peak grad:    131.3 mmHg MR Mean grad:    97.0 mmHg    SHUNTS MR Vmax:         573.00 cm/s  Systemic VTI:  0.22 m MR Vmean:        469.0 cm/s   Systemic Diam: 1.90 cm MR PISA:         4.02 cm MR PISA Eff ROA: 27 mm MR PISA Radius:  0.80 cm MV E velocity: 1.12 cm/s MV A velocity: 85.90 cm/s MV E/A ratio:  0.01 Epifanio Lesches MD Electronically signed by Epifanio Lesches MD Signature Date/Time: 04/14/2023/5:18:54 PM    Final     sodium chloride   Intravenous Once   amLODipine  10 mg Oral Daily   bisacodyl  10 mg Oral Daily   carvedilol  6.25 mg Oral BID WC   feeding supplement  237 mL Oral BID BM   lidocaine  20 mL Intradermal Once   lidocaine  20 mL Infiltration Once   pantoprazole  40 mg Oral BID   sucralfate  1 g Oral BID    BMET    Component Value Date/Time   NA 141 04/16/2023 0419   K 3.3 (L) 04/16/2023 0419   CL 114 (H) 04/16/2023 0419   CO2 16 (L) 04/16/2023 0419   GLUCOSE 91 04/16/2023 0419   BUN 32 (H) 04/16/2023 0419   CREATININE 3.54 (H) 04/16/2023 0419   CALCIUM 7.7 (L) 04/16/2023 0419   GFRNONAA 18 (L) 04/16/2023 0419   GFRAA 50 (L) 08/06/2018 1125   CBC     Component Value Date/Time   WBC 11.1 (H) 04/16/2023 0419   RBC 2.80 (L) 04/16/2023 0419   HGB 8.8 (L) 04/16/2023 0419   HCT 25.9 (L) 04/16/2023 0419   PLT 399 04/16/2023 0419   MCV 92.5 04/16/2023 0419   MCH 31.4 04/16/2023 0419   MCHC 34.0 04/16/2023 0419   RDW 15.9 (H) 04/16/2023 0419   LYMPHSABS 0.7 04/16/2023  0419   MONOABS 0.9 04/16/2023 0419   EOSABS 0.1 04/16/2023 0419   BASOSABS 0.0 04/16/2023 0419    Assessment/Plan:  AKI/CKD stage IIIa vs progressive CKD stage IV - unknown baseline Scr as no labs since 2019.  Current presentation, likely ischemic ATN in setting of poor po intake and symptomatic ABLA with Hgb of 6.5.  Scr mildly improved to 3.83 and now 3.54 this morning.  Hopefully will continue to improve although this may be his new baseline.  CT of abd and pelvis without obstruction.  UA with small blood and protein.  No uremic symptoms.  No indication for dialysis at this time.  Will follow UOP and SCr after he receives blood transfusion.  GN workup with negative ANA, ANCA, dsDNA, ASO and normal complements.      Avoid nephrotoxic medications including NSAIDs and iodinated intravenous contrast exposure unless the latter is absolutely indicated.  Preferred narcotic agents for pain control are hydromorphone, fentanyl, and methadone. Morphine should not be used. Avoid Baclofen and avoid oral sodium phosphate and magnesium citrate based laxatives / bowel preps. Continue strict Input and Output monitoring. Will monitor the patient closely with you and intervene or adjust therapy as indicated by changes in clinical status/labs  ABLA - had episode of melena at [facility], heme +.  S/p blood transfusion with increased Hgb to 9.4.  GI evaluation underway. Left peroneal vein thrombosis - cannot use anticoagulation given GIB, s/p IVC filter.  Workup per primary svc. Hypokalemia - replete and follow.   Hypomagnesium - replete and follow.  Abnormal LFT's - jump in AST/ALT.   Workup per primary svc. Etoh abuse - he admits to drinking again but thinks the last drink was 2 weeks ago, although he said he drinks so he can sleep due to the pain in his feet which just started this week.  Irena Cords, MD [facility]

## 2023-04-16 NOTE — Anesthesia Postprocedure Evaluation (Signed)
Anesthesia Post Note  Patient: Timothy Chambers  Procedure(s) Performed: ESOPHAGOGASTRODUODENOSCOPY (EGD) COLONOSCOPY WITH PROPOFOL BIOPSY POLYPECTOMY     Patient location during evaluation: PACU Anesthesia Type: MAC Level of consciousness: awake Pain management: pain level controlled Vital Signs Assessment: post-procedure vital signs reviewed and stable Respiratory status: spontaneous breathing, nonlabored ventilation and respiratory function stable Cardiovascular status: stable and blood pressure returned to baseline Postop Assessment: no apparent nausea or vomiting Anesthetic complications: no   No notable events documented.  Last Vitals:  Vitals:   04/16/23 1145 04/16/23 1206  BP: (!) 183/93 (!) 217/93  Pulse: 99 (!) 102  Resp: (!) 23 17  Temp:  36.9 C  SpO2: 96%     Last Pain:  Vitals:   04/16/23 1206  TempSrc: Oral  PainSc:                  Linton Rump

## 2023-04-16 NOTE — Interval H&P Note (Signed)
History and Physical Interval Note:  04/16/2023 9:29 AM  Timothy Chambers  has presented today for surgery, with the diagnosis of IDA, VTE with need for anticoagulation.  The various methods of treatment have been discussed with the patient and family. After consideration of risks, benefits and other options for treatment, the patient has consented to  Procedure(s): ESOPHAGOGASTRODUODENOSCOPY (EGD) (N/A) COLONOSCOPY WITH PROPOFOL (N/A) as a surgical intervention.  The patient's history has been reviewed, patient examined, no change in status, stable for surgery.  I have reviewed the patient's chart and labs.  Questions were answered to the patient's satisfaction.     [employer]

## 2023-04-16 NOTE — Plan of Care (Signed)
  Problem: Education: Goal: Knowledge of General Education information will improve Description: Including pain rating scale, medication(s)/side effects and non-pharmacologic comfort measures Outcome: Progressing   Problem: Health Behavior/Discharge Planning: Goal: Ability to manage health-related needs will improve Outcome: Progressing   Problem: Clinical Measurements: Goal: Ability to maintain clinical measurements within normal limits will improve Outcome: Progressing Goal: Will remain free from infection Outcome: Progressing Goal: Diagnostic test results will improve Outcome: Progressing Goal: Respiratory complications will improve Outcome: Progressing Goal: Cardiovascular complication will be avoided Outcome: Progressing   Problem: Activity: Goal: Risk for activity intolerance will decrease Outcome: Progressing   Problem: Nutrition: Goal: Adequate nutrition will be maintained Outcome: Progressing   Problem: Coping: Goal: Level of anxiety will decrease Outcome: Progressing   Problem: Elimination: Goal: Will not experience complications related to bowel motility Outcome: Progressing Goal: Will not experience complications related to urinary retention Outcome: Progressing   Problem: Pain Managment: Goal: General experience of comfort will improve Outcome: Progressing   Problem: Safety: Goal: Ability to remain free from injury will improve Outcome: Progressing   Problem: Skin Integrity: Goal: Risk for impaired skin integrity will decrease Outcome: Progressing   Problem: Education: Goal: Knowledge of disease and its progression will improve Outcome: Progressing   Problem: Health Behavior/Discharge Planning: Goal: Ability to manage health-related needs will improve Outcome: Progressing   Problem: Clinical Measurements: Goal: Complications related to the disease process or treatment will be avoided or minimized Outcome: Progressing Goal: Dialysis access  will remain free of complications Outcome: Progressing   Problem: Activity: Goal: Activity intolerance will improve Outcome: Progressing   Problem: Fluid Volume: Goal: Fluid volume balance will be maintained or improved Outcome: Progressing   Problem: Nutritional: Goal: Ability to make appropriate dietary choices will improve Outcome: Progressing   Problem: Respiratory: Goal: Respiratory symptoms related to disease process will be avoided Outcome: Progressing   Problem: Self-Concept: Goal: Body image disturbance will be avoided or minimized Outcome: Progressing   Problem: Urinary Elimination: Goal: Progression of disease will be identified and treated Outcome: Progressing   Problem: Education: Goal: Ability to identify signs and symptoms of gastrointestinal bleeding will improve Outcome: Progressing   Problem: Bowel/Gastric: Goal: Will show no signs and symptoms of gastrointestinal bleeding Outcome: Progressing   Problem: Fluid Volume: Goal: Will show no signs and symptoms of excessive bleeding Outcome: Progressing   Problem: Clinical Measurements: Goal: Complications related to the disease process, condition or treatment will be avoided or minimized Outcome: Progressing   Problem: Education: Goal: Understanding of CV disease, CV risk reduction, and recovery process will improve Outcome: Progressing Goal: Individualized Educational Video(s) Outcome: Progressing   Problem: Activity: Goal: Ability to return to baseline activity level will improve Outcome: Progressing   Problem: Cardiovascular: Goal: Ability to achieve and maintain adequate cardiovascular perfusion will improve Outcome: Progressing Goal: Vascular access site(s) Level 0-1 will be maintained Outcome: Progressing   Problem: Health Behavior/Discharge Planning: Goal: Ability to safely manage health-related needs after discharge will improve Outcome: Progressing   

## 2023-04-16 NOTE — Op Note (Addendum)
[facility] Patient Name: Timothy Chambers Procedure Date : 04/16/2023 MRN: [id number] Attending MD: Corliss Parish , MD, [id number] Date of Birth: Mar 11, 1956 CSN: [id number] Age: 67 Admit Type: Inpatient Procedure:                Colonoscopy Indications:              Heme positive stool, Melena, Iron deficiency anemia Providers:                Corliss Parish, MD, Fransisca Connors, Cephus Richer, RN, Marja Kays, Technician Referring MD:             Inpatient medical service Medicines:                Monitored Anesthesia Care Complications:            No immediate complications. Estimated Blood Loss:     Estimated blood loss was minimal. Procedure:                Pre-Anesthesia Assessment:                           - Prior to the procedure, a History and Physical                            was performed, and patient medications and                            allergies were reviewed. The patient's tolerance of                            previous anesthesia was also reviewed. The risks                            and benefits of the procedure and the sedation                            options and risks were discussed with the patient.                            All questions were answered, and informed consent                            was obtained. Prior Anticoagulants: The patient has                            taken no anticoagulant or antiplatelet agents. ASA                            Grade Assessment: III - A patient with severe                            systemic disease. After reviewing the risks and  benefits, the patient was deemed in satisfactory                            condition to undergo the procedure.                           After obtaining informed consent, the colonoscope                            was passed under direct vision. Throughout the                            procedure, the  patient's blood pressure, pulse, and                            oxygen saturations were monitored continuously. The                            CF-HQ190L ([id number] Olympus coloscope was                            introduced through the anus and advanced to the 10                            cm into the ileum. The colonoscopy was somewhat                            difficult due to a tortuous colon. Successful                            completion of the procedure was aided by changing                            the patient's position, using manual pressure,                            straightening and shortening the scope to obtain                            bowel loop reduction and using scope torsion. The                            patient tolerated the procedure. The quality of the                            bowel preparation was fair. The terminal ileum,                            ileocecal valve, appendiceal orifice, and rectum                            were photographed. Scope In: 10:45:43 AM Scope Out: 11:09:53 AM Scope Withdrawal Time: 0 hours 18 minutes 15 seconds  Total Procedure Duration: 0  hours 24 minutes 10 seconds  Findings:      The digital rectal exam findings include hemorrhoids. Pertinent       negatives include no palpable rectal lesions.      The left colon was significantly tortuous.      Extensive amounts of semi-liquid stool was found in the entire colon,       making visualization difficult. Lavage of the area was performed using       copious amounts, resulting in clearance with only fair visualization.      The terminal ileum and ileocecal valve appeared normal. Biopsies were       taken with a cold forceps for histology.      Two sessile polyps were found in the sigmoid colon and descending colon.       The polyps were 4 to 10 mm in size. These polyps were removed with a       cold snare. Resection and retrieval were complete.      Diffuse, moderately  erythematous and friable mucosa was found in the       transverse colon, at the hepatic flexure, in the ascending colon and in       the cecum. Biopsies were taken with a cold forceps for histology.      Normal mucosa was found in the recto-sigmoid colon, in the sigmoid colon       and in the descending colon. Biopsies were taken with a cold forceps for       histology.      Normal mucosa was found in the rectum. Biopsies were taken with a cold       forceps for histology.      Multiple small-mouthed diverticula were found in the recto-sigmoid colon       and sigmoid colon.      Non-bleeding non-thrombosed external and internal hemorrhoids were found       during retroflexion, during perianal exam and during digital exam. The       hemorrhoids were Grade II (internal hemorrhoids that prolapse but reduce       spontaneously). Impression:               - Preparation of the colon was fair even after                            copious lavage.                           - Hemorrhoids found on digital rectal exam.                           - Tortuous left colon.                           - The examined portion of the ileum was normal.                            Biopsied.                           - Two, 4 to 10 mm polyps in the sigmoid colon and  in the descending colon, removed with a cold snare.                            Resected and retrieved.                           - Erythematous mucosa in the transverse colon, at                            the hepatic flexure, in the ascending colon and in                            the cecum. Biopsied.                           - Normal mucosa in the recto-sigmoid colon, in the                            sigmoid colon and in the descending colon. Biopsied.                           - Normal mucosa in the rectum. Biopsied.                           - Diverticulosis in the recto-sigmoid colon and in                             the sigmoid colon.                           - Non-bleeding non-thrombosed external and internal                            hemorrhoids. Recommendation:           - The patient will be observed post-procedure,                            until all discharge criteria are met.                           - Return patient to hospital ward for ongoing care.                           - Patient has a contact number available for                            emergencies. The signs and symptoms of potential                            delayed complications were discussed with the                            patient. Return to normal activities tomorrow.  Written discharge instructions were provided to the                            patient.                           - High fiber diet.                           - Use FiberCon 1-2 tablets PO daily.                           - If anticoagulation is required, can consider                            initiation with IV heparin drip in 6-12 hours. If                            DOAC to be considered then would wait 48 hours.                           - Continue present medications.                           - Await pathology results.                           - Repeat colonoscopy within 1 year because the                            bowel preparation was inadequate.                           - Pending pathology, if evidence of chronic colitis                            is found we will need some other therapies.                           - No further inpatient GI workup is necessary at                            this time. Follow-up as an outpatient can be                            arranged pending pathology. The inpatient GI                            service will sign off at this time.                           - The findings and recommendations were discussed                            with the patient.                           -  The findings and recommendations were discussed                            with the referring physician. Procedure Code(s):        --- Professional ---                           [phone], Colonoscopy, flexible; with removal of                            tumor(s), polyp(s), or other lesion(s) by snare                            technique                           45380, 59, Colonoscopy, flexible; with biopsy,                            single or multiple Diagnosis Code(s):        --- Professional ---                           K64.1, Second degree hemorrhoids                           D12.5, Benign neoplasm of sigmoid colon                           D12.4, Benign neoplasm of descending colon                           K63.89, Other specified diseases of intestine                           R19.5, Other fecal abnormalities                           K92.1, Melena (includes Hematochezia)                           D50.9, Iron deficiency anemia, unspecified                           K57.30, Diverticulosis of large intestine without                            perforation or abscess without bleeding                           Q43.8, Other specified congenital malformations of                            intestine CPT copyright 2022 [employer]. All rights reserved. The codes documented in this report are preliminary and upon coder review may  be revised to meet current compliance requirements. Corliss Parish, MD 04/16/2023 11:39:37 AM Number of Addenda: 0

## 2023-04-16 NOTE — Op Note (Signed)
[facility] Patient Name: Timothy Chambers Procedure Date : 04/16/2023 MRN: [id number] Attending MD: Corliss Parish , MD, [id number] Date of Birth: 08-13-1956 CSN: [id number] Age: 67 Admit Type: Inpatient Procedure:                Upper GI endoscopy Indications:              Iron deficiency anemia, Heme positive stool Providers:                Corliss Parish, MD, Fransisca Connors, Cephus Richer, RN, Marja Kays, Technician Referring MD:             Inpatient medical service Medicines:                Monitored Anesthesia Care Complications:            No immediate complications. Estimated Blood Loss:     Estimated blood loss was minimal. Procedure:                Pre-Anesthesia Assessment:                           - Prior to the procedure, a History and Physical                            was performed, and patient medications and                            allergies were reviewed. The patient's tolerance of                            previous anesthesia was also reviewed. The risks                            and benefits of the procedure and the sedation                            options and risks were discussed with the patient.                            All questions were answered, and informed consent                            was obtained. Prior Anticoagulants: The patient has                            taken no anticoagulant or antiplatelet agents. ASA                            Grade Assessment: III - A patient with severe                            systemic disease. After reviewing the risks and  benefits, the patient was deemed in satisfactory                            condition to undergo the procedure.                           After obtaining informed consent, the endoscope was                            passed under direct vision. Throughout the                            procedure, the  patient's blood pressure, pulse, and                            oxygen saturations were monitored continuously. The                            [id number]-H190 ([id number]) Olympus endoscope was introduced                            through the mouth, and advanced to the second part                            of duodenum. The upper GI endoscopy was                            accomplished without difficulty. The patient                            tolerated the procedure. Scope In: Scope Out: Findings:      White nummular lesions were noted in the entire esophagus. Biopsies were       taken with a cold forceps for histology.      LA Grade B (one or more mucosal breaks greater than 5 mm, not extending       between the tops of two mucosal folds) esophagitis with no bleeding was       found in the very distal esophagus.      A 2 cm hiatal hernia was present.      Patchy mild inflammation characterized by erosions, erythema and       friability was found in the entire examined stomach. Biopsies were taken       with a cold forceps for histology and Helicobacter pylori testing.      Patchy moderate inflammation characterized by congestion (edema),       erythema and friability was found in the duodenal bulb, in the first       portion of the duodenum and in the second portion of the duodenum.       Biopsies were taken with a cold forceps for histology. Impression:               - White nummular lesions in esophageal mucosa.                            Biopsied.                           -  LA Grade B esophagitis with no bleeding found in                            the very distal esophagus.                           - 2 cm hiatal hernia.                           - Gastritis. Biopsied.                           - Duodenitis. Biopsied. Recommendation:           - Proceed to scheduled colonoscopy.                           - Continue present medications.                           - Await pathology  results.                           - Observe patient's clinical course.                           - May transition IV PPI to p.o. PPI 40 mg twice                            daily.                           - Consider repeat EGD in 4 months to ensure healing                            of esophagitis and make sure that underlying                            Barrett's esophagus is not present.                           - The findings and recommendations were discussed                            with the patient.                           - The findings and recommendations were discussed                            with the referring physician. Procedure Code(s):        --- Professional ---                           [phone], Esophagogastroduodenoscopy, flexible,                            transoral;  with biopsy, single or multiple Diagnosis Code(s):        --- Professional ---                           K22.89, Other specified disease of esophagus                           K20.90, Esophagitis, unspecified without bleeding                           K44.9, Diaphragmatic hernia without obstruction or                            gangrene                           K29.70, Gastritis, unspecified, without bleeding                           K29.80, Duodenitis without bleeding                           D50.9, Iron deficiency anemia, unspecified                           R19.5, Other fecal abnormalities CPT copyright 2022 [employer]. All rights reserved. The codes documented in this report are preliminary and upon coder review may  be revised to meet current compliance requirements. Corliss Parish, MD 04/16/2023 11:32:47 AM Number of Addenda: 0

## 2023-04-17 DIAGNOSIS — I82452 Acute embolism and thrombosis of left peroneal vein: Secondary | ICD-10-CM | POA: Diagnosis not present

## 2023-04-17 DIAGNOSIS — K921 Melena: Secondary | ICD-10-CM | POA: Diagnosis not present

## 2023-04-17 DIAGNOSIS — D509 Iron deficiency anemia, unspecified: Secondary | ICD-10-CM | POA: Diagnosis not present

## 2023-04-17 DIAGNOSIS — R600 Localized edema: Secondary | ICD-10-CM | POA: Diagnosis not present

## 2023-04-17 DIAGNOSIS — I10 Essential (primary) hypertension: Secondary | ICD-10-CM

## 2023-04-17 DIAGNOSIS — E876 Hypokalemia: Secondary | ICD-10-CM | POA: Diagnosis not present

## 2023-04-17 DIAGNOSIS — N179 Acute kidney failure, unspecified: Secondary | ICD-10-CM | POA: Diagnosis not present

## 2023-04-17 DIAGNOSIS — K922 Gastrointestinal hemorrhage, unspecified: Secondary | ICD-10-CM | POA: Diagnosis not present

## 2023-04-17 LAB — BASIC METABOLIC PANEL
Anion gap: 11 (ref 5–15)
BUN: 29 mg/dL — ABNORMAL HIGH (ref 8–23)
CO2: 18 mmol/L — ABNORMAL LOW (ref 22–32)
Calcium: 7.7 mg/dL — ABNORMAL LOW (ref 8.9–10.3)
Chloride: 108 mmol/L (ref 98–111)
Creatinine, Ser: 3.35 mg/dL — ABNORMAL HIGH (ref 0.61–1.24)
GFR, Estimated: 20 mL/min — ABNORMAL LOW (ref >60)
Glucose, Bld: 88 mg/dL (ref 70–99)
Potassium: 3.3 mmol/L — ABNORMAL LOW (ref 3.5–5.1)
Sodium: 137 mmol/L (ref 135–145)

## 2023-04-17 LAB — CBC
HCT: 25.2 % — ABNORMAL LOW (ref 39.0–52.0)
Hemoglobin: 8.6 g/dL — ABNORMAL LOW (ref 13.0–17.0)
MCH: 31.2 pg (ref 26.0–34.0)
MCHC: 34.1 g/dL (ref 30.0–36.0)
MCV: 91.3 fL (ref 80.0–100.0)
Platelets: 400 10*3/uL (ref 150–400)
RBC: 2.76 MIL/uL — ABNORMAL LOW (ref 4.22–5.81)
RDW: 15.9 % — ABNORMAL HIGH (ref 11.5–15.5)
WBC: 12.5 10*3/uL — ABNORMAL HIGH (ref 4.0–10.5)
nRBC: 0 % (ref 0.0–0.2)

## 2023-04-17 LAB — MAGNESIUM: Magnesium: 1.5 mg/dL — ABNORMAL LOW (ref 1.7–2.4)

## 2023-04-17 MED ORDER — CARVEDILOL 6.25 MG PO TABS: 6.2500 mg | ORAL_TABLET | Freq: Two times a day (BID) | ORAL | 2 refills | Status: AC

## 2023-04-17 MED ORDER — ENSURE ENLIVE PO LIQD: 237.0000 mL | Freq: Two times a day (BID) | ORAL | 0 refills | Status: AC

## 2023-04-17 MED ORDER — AMLODIPINE BESYLATE 10 MG PO TABS: 10.0000 mg | ORAL_TABLET | Freq: Every day | ORAL | 2 refills | Status: AC

## 2023-04-17 MED ORDER — SODIUM BICARBONATE 650 MG PO TABS: 650.0000 mg | ORAL_TABLET | Freq: Two times a day (BID) | ORAL | 2 refills | Status: AC

## 2023-04-17 MED ORDER — PANTOPRAZOLE SODIUM 40 MG PO TBEC: 40.0000 mg | DELAYED_RELEASE_TABLET | Freq: Two times a day (BID) | ORAL | 2 refills | Status: AC

## 2023-04-17 MED ORDER — POTASSIUM CHLORIDE 20 MEQ PO PACK: 40.0000 meq | PACK | Freq: Once | ORAL | Status: DC

## 2023-04-17 MED ORDER — MAGNESIUM SULFATE 4 GM/100ML IV SOLN
4.0000 g | Freq: Once | INTRAVENOUS | Status: DC
  Filled 2023-04-17: qty 100

## 2023-04-17 MED ORDER — FIBERCON 625 MG PO TABS: 625.0000 mg | ORAL_TABLET | Freq: Every day | ORAL | 2 refills | Status: AC

## 2023-04-17 NOTE — Progress Notes (Signed)
Insisting on being discharged home today-he is already dressed up-claims that he has a dog to take care of and his car has been parked in a location for 6 days.  Completely awake and alert-requesting discharge-adamant on not staying any longer.  Patient will be discharged home at his own request-he is aware that this is not an ideal situation-probably needs to stay at least another day-or two in the hospital for continued monitoring-consideration of starting anticoagulation etc. he understands the life-threatening and life disabling risks of leaving the hospital before being medically ready for discharge.

## 2023-04-17 NOTE — Discharge Summary (Signed)
PATIENT DETAILS Name: Timothy Chambers Age: 67 y.o. Sex: male Date of Birth: 1956-05-14 MRN: [id number]. Admitting Physician: Emeline General, MD WUJ:[id number], Timothy Cella, MD  Admit Date: 04/13/2023 Discharge date: 04/17/2023  Recommendations for Outpatient Follow-up:  Follow up with PCP in 1-2 weeks Please obtain CMP/CBC in one week Continue to emphasize importance of compliance to medications and to follow-up If he demonstrates compliance to follow-up-consider starting Eliquis Ensure follow-up with nephrology Colonoscopy/endoscopy biopsies pending-please follow  Admitted From:  Home  Disposition: Home   Discharge Condition: fair  CODE STATUS:   Code Status: Full Code   Diet recommendation:  Diet Order             Diet - low sodium heart healthy           Diet Heart Room service appropriate? Yes; Fluid consistency: Thin  Diet effective now                    Brief Summary: Patient is a 67 y.o.  male with HTN, CKD stage IIIa-who presented with weakness/malaise, poor oral intake, LLE pain, maroon-colored stools--found to have lower GI bleeding with acute blood loss anemia, AKI, LLE DVT.   Significant events: 7/2>> admit to [facility]   Significant studies: 7/2>> CT abdomen/pelvis: No acute findings.  No hydronephrosis.  Diverticulosis. 7/2>> CXR: No PNA 7/2>> LLE Doppler: Left lower extremity DVT.   Significant microbiology data: None   Procedures: 7/3>> IVC filter placement by IR 7/5>> colonoscopy: 2 polyps in sigmoid colon/descending colon, diverticulosis 7/5>> EGD: Gastritis/duodenitis   Consults: Pathology GI IR.   Brief Hospital Course: AKI on CKD stage IIIa Hemodynamically mediated in the setting of GI bleeding/NSAID use UA with some protein-CT abdomen without and hydronephrosis Renal function gradually improving  autoimmune panel negative so far Protein electrophoresis negative for M spike. Plan was to continue monitoring patient and let creatinine  downtrend further before consideration of discharge to see where his baseline was-however patient adamant on leaving the hospital today and is being discharged home at his own request-please see my prior documentation. Spoke with nephrologist Dr. Terese Door will arrange follow-up-however challenging part will be compliance.    GI bleeding (maroon-colored stools seen on 7/2 at med center [facility]) with acute blood loss anemia No further GI bleeding since admission on 7/2  Hb now stable after 2 units of PRBC transfusion on 7/3  Endoscopy/colonoscopy as above-please follow biopsy results In retrospect-could have had diverticular bleeding Continue PPI discharge   Multifactorial anemia Suspect anemia as not just from ABLA-suspect some amount of anemia from underlying CKD. Hb stable after 2 units of PRBC on 7/3   LLE DVT Unable to anticoagulate on admission due to severity anemia and GI bleeding IR placed IVC filter on 7/3 EGD/colonoscopy was essentially unremarkable-plan was to start IV heparin on 7/6-and if he remained stable without any evidence of GI bleed-plan was to put him on Eliquis and discharged home.  However he is adamant on leaving the hospital today-given challenges with compliance-I will leave him off anticoagulation as he already has a filter in place, if he were to regularly follow-up with his primary care practitioner and other outpatient physicians-consideration can be given to have him start on Eliquis.   Hypokalemia/hypomagnesemia Plan is to replete before he discharges-however that may not be possible as he is adamant on leaving the hospital soon. PCP to recheck electrolytes in 1 week.   Nonanion gap metabolic acidosis Secondary to CKD/AKI Bicarb better with isotonic bicarb  infusion Will plan on oral bicarb supplementation on discharge.   HTN BP better with amlodipine/Coreg.  Continue on discharge-compliance emphasized.  d 7/5.     Weight loss/cachexia Apparently  unintentional TSH stable HIV negative Colonoscopy/endoscopy as above-biopsy pending   Medical noncompliance Initially refused blood work/PRBC etc.-after extensive discussion on 7/3-after being made aware of life-threatening/life disabling effects of refusal of treatment-he is now much more cooperative.  Agreed for PRBC transfusion-and underwent IVC filter/colonoscopy/endoscopy-unfortunately on 7/6-adamant on leaving the hospital-see my prior documentation.     Debility/deconditioning PT/OT eval was planned-however he is leaving the hospital at his own request today-apparently per patient-he is ambulating fine in the room-independently.  Discharge Diagnoses:  Principal Problem:   ARF (acute renal failure) (HCC) Active Problems:   HTN (hypertension)   DVT (deep venous thrombosis) (HCC)   Lower GI bleed   GI bleed   Iron deficiency anemia   Melena   Discharge Instructions:  Activity:  As tolerated  Discharge Instructions     Call MD for:  extreme fatigue   Complete by: As directed    Call MD for:  persistant dizziness or light-headedness   Complete by: As directed    Call MD for:  redness, tenderness, or signs of infection (pain, swelling, redness, odor or green/yellow discharge around incision site)   Complete by: As directed    Diet - low sodium heart healthy   Complete by: As directed    Discharge instructions   Complete by: As directed    Follow with Primary MD  Dolan Amen, MD in 1-2 weeks  Follow-up with nephrology-the office will give you a call with a follow-up appointment  Endoscopy/colonoscopy biopsy results are pending-these will need to be followed  If you have regular follow-up with her primary care practitioner-consideration can be given to placing you on a blood thinner to treat your lower extremity deep vein thrombosis.  Please take your medications as prescribed and follow-up with your primary care doctor.  Please get a complete blood count and  chemistry panel checked by your Primary MD at your next visit, and again as instructed by your Primary MD.  Get Medicines reviewed and adjusted: Please take all your medications with you for your next visit with your Primary MD  Laboratory/radiological data: Please request your Primary MD to go over all hospital tests and procedure/radiological results at the follow up, please ask your Primary MD to get all Hospital records sent to his/her office.  In some cases, they will be blood work, cultures and biopsy results pending at the time of your discharge. Please request that your primary care M.D. follows up on these results.  Also Note the following: If you experience worsening of your admission symptoms, develop shortness of breath, life threatening emergency, suicidal or homicidal thoughts you must seek medical attention immediately by calling 911 or calling your MD immediately  if symptoms less severe.  You must read complete instructions/literature along with all the possible adverse reactions/side effects for all the Medicines you take and that have been prescribed to you. Take any new Medicines after you have completely understood and accpet all the possible adverse reactions/side effects.   Do not drive when taking Pain medications or sleeping medications (Benzodaizepines)  Do not take more than prescribed Pain, Sleep and Anxiety Medications. It is not advisable to combine anxiety,sleep and pain medications without talking with your primary care practitioner  Special Instructions: If you have smoked or chewed Tobacco  in the last 2 yrs  please stop smoking, stop any regular Alcohol  and or any Recreational drug use.  Wear Seat belts while driving.  Please note: You were cared for by a hospitalist during your hospital stay. Once you are discharged, your primary care physician will handle any further medical issues. Please note that NO REFILLS for any discharge medications will be  authorized once you are discharged, as it is imperative that you return to your primary care physician (or establish a relationship with a primary care physician if you do not have one) for your post hospital discharge needs so that they can reassess your need for medications and monitor your lab values.   Increase activity slowly   Complete by: As directed    No wound care   Complete by: As directed       Allergies as of 04/17/2023       Reactions   Norco [hydrocodone-acetaminophen] Itching        Medication List     STOP taking these medications    acetaminophen 500 MG tablet Commonly known as: TYLENOL   hydrochlorothiazide 25 MG tablet Commonly known as: HYDRODIURIL   sildenafil 100 MG tablet Commonly known as: VIAGRA       TAKE these medications    amLODipine 10 MG tablet Commonly known as: NORVASC Take 1 tablet (10 mg total) by mouth daily. What changed:  medication strength how much to take   carvedilol 6.25 MG tablet Commonly known as: COREG Take 1 tablet (6.25 mg total) by mouth 2 (two) times daily with a meal.   feeding supplement Liqd Take 237 mLs by mouth 2 (two) times daily between meals.   FiberCon 625 MG tablet Generic drug: polycarbophil Take 1 tablet (625 mg total) by mouth daily.   pantoprazole 40 MG tablet Commonly known as: PROTONIX Take 1 tablet (40 mg total) by mouth 2 (two) times daily.   sodium bicarbonate 650 MG tablet Take 1 tablet (650 mg total) by mouth 2 (two) times daily.        Follow-up Information     Dolan Amen, MD. Schedule an appointment as soon as possible for a visit in 1 week(s).   Specialty: Internal Medicine Contact information: [address] [place] [address] [phone]         Terrial Rhodes, MD Follow up.   Specialty: Nephrology Why: Office will call with date/time, If you dont hear from them,please give them a call Contact information: [address] [place]  [phone]                Allergies  Allergen Reactions   Norco [Hydrocodone-Acetaminophen] Itching     Other Procedures/Studies: IR IVC FILTER PLMT / S&I Lenise Arena GUID/MOD SED  Result Date: 04/16/2023 INDICATION: LEFT lower extremity DVT. Unable to tolerate anticoagulation secondary to GI bleed. Renal dysfunction, not for contrast administration. EXAM: Procedures: 1. INFERIOR VENA CAVA FILTER PLACEMENT 2. INTRAVASCULAR ULTRASOUND (IVUS) MEDICATIONS: 10 mL lidocaine 1% ANESTHESIA/SEDATION: Local anesthetic was administered. The patient was continuously monitored during the procedure by the interventional radiology nurse under my direct supervision. CONTRAST:  None FLUOROSCOPY TIME:  Fluoroscopic dose; 5 mGy COMPLICATIONS: None immediate. PROCEDURE: Informed written consent was obtained from the the patient and/or patient's representative following explanation of the procedure, risks, benefits and alternatives. A time out was performed prior to the initiation of the procedure. Maximal barrier sterile technique utilized including caps, mask, sterile gowns, sterile gloves, large sterile drape, hand hygiene, and sterile prep. Under sterile  condition and local anesthesia, RIGHT common femoral venous access was performed with ultrasound. An ultrasound image was saved and sent to PACS. Over a guidewire, an 0.035 inch intravascular ultrasound (IVUS) catheter was inserted and used to map out the patient's renal veins. Was noted and marked, the IVC filter delivery sheath and inner dilator were advanced into the IVC. Through the delivery sheath, a retrievable Denali IVC filter was deployed below the level of the renal veins and above the IVC bifurcation. Limited post deployment venacavagram was performed. The delivery sheath was removed and hemostasis was obtained with manual compression. A dressing was placed. The patient tolerated the procedure well without immediate post procedural complication.  FINDINGS: Intravascular ultrasound demonstrating a patent inferior vena cava. No evidence of thrombus, stenosis, or occlusion. Successful placement of the IVC filter below the level of the renal veins. IMPRESSION: Successful placement of a retrievable, retrievable inferior vena cava (IVC) filter, using intravascular ultrasound (IVUS) and fluoroscopy. PLAN: IVC filters can cause complications when left in place for extended periods of time. If medically appropriate, recommend discontinuing filter prior to discharge. Please re-evaluate the patient for filter discontinuation when they are seen in follow up, and refer patient to Interventional Radiology for removal. Roanna Banning, MD [facility] Radiology Electronically Signed   By: Roanna Banning M.D.   On: 04/16/2023 17:49   IR INTRAVASCULAR ULTRASOUND NON CORONARY  Result Date: 04/16/2023 INDICATION: LEFT lower extremity DVT. Unable to tolerate anticoagulation secondary to GI bleed. Renal dysfunction, not for contrast administration. EXAM: Procedures: 1. INFERIOR VENA CAVA FILTER PLACEMENT 2. INTRAVASCULAR ULTRASOUND (IVUS) MEDICATIONS: 10 mL lidocaine 1% ANESTHESIA/SEDATION: Local anesthetic was administered. The patient was continuously monitored during the procedure by the interventional radiology nurse under my direct supervision. CONTRAST:  None FLUOROSCOPY TIME:  Fluoroscopic dose; 5 mGy COMPLICATIONS: None immediate. PROCEDURE: Informed written consent was obtained from the the patient and/or patient's representative following explanation of the procedure, risks, benefits and alternatives. A time out was performed prior to the initiation of the procedure. Maximal barrier sterile technique utilized including caps, mask, sterile gowns, sterile gloves, large sterile drape, hand hygiene, and sterile prep. Under sterile condition and local anesthesia, RIGHT common femoral venous access was performed with ultrasound.  An ultrasound image was saved and sent to PACS. Over a guidewire, an 0.035 inch intravascular ultrasound (IVUS) catheter was inserted and used to map out the patient's renal veins. Was noted and marked, the IVC filter delivery sheath and inner dilator were advanced into the IVC. Through the delivery sheath, a retrievable Denali IVC filter was deployed below the level of the renal veins and above the IVC bifurcation. Limited post deployment venacavagram was performed. The delivery sheath was removed and hemostasis was obtained with manual compression. A dressing was placed. The patient tolerated the procedure well without immediate post procedural complication. FINDINGS: Intravascular ultrasound demonstrating a patent inferior vena cava. No evidence of thrombus, stenosis, or occlusion. Successful placement of the IVC filter below the level of the renal veins. IMPRESSION: Successful placement of a retrievable, retrievable inferior vena cava (IVC) filter, using intravascular ultrasound (IVUS) and fluoroscopy. PLAN: IVC filters can cause complications when left in place for extended periods of time. If medically appropriate, recommend discontinuing filter prior to discharge. Please re-evaluate the patient for filter discontinuation when they are seen in follow up, and refer patient to Interventional Radiology for removal. Roanna Banning, MD [facility] Radiology Electronically Signed   By: Roanna Banning  M.D.   On: 04/16/2023 17:49   ECHOCARDIOGRAM COMPLETE  Result Date: 04/14/2023    ECHOCARDIOGRAM REPORT   Patient Name:   Timothy Chambers Date of Exam: 04/14/2023 Medical Rec #:  [id number]       Height:       65.0 in Accession #:    [id number]      Weight:       130.0 lb Date of Birth:  11-20-1955       BSA:          1.647 m Patient Age:    67 years        BP:           160/78 mmHg Patient Gender: M               HR:           90 bpm. Exam Location:  Inpatient Procedure: 2D  Echo, Cardiac Doppler and Color Doppler Indications:    CHF  History:        Patient has no prior history of Echocardiogram examinations.                 Risk Factors:Hypertension.  Sonographer:    Darlys Gales Referring Phys: [id number] PING T ZHANG IMPRESSIONS  1. Left ventricular ejection fraction, by estimation, is 60 to 65%. The left ventricle has normal function. The left ventricle has no regional wall motion abnormalities. There is moderate left ventricular hypertrophy. Left ventricular diastolic parameters were normal.  2. Right ventricular systolic function is normal. The right ventricular size is normal. There is normal pulmonary artery systolic pressure. The estimated right ventricular systolic pressure is 30.5 mmHg.  3. The mitral valve is normal in structure. Mild mitral valve regurgitation.  4. The aortic valve is tricuspid. Aortic valve regurgitation is trivial. Aortic valve sclerosis is present, with no evidence of aortic valve stenosis.  5. The inferior vena cava is dilated in size with >50% respiratory variability, suggesting right atrial pressure of 8 mmHg. FINDINGS  Left Ventricle: Left ventricular ejection fraction, by estimation, is 60 to 65%. The left ventricle has normal function. The left ventricle has no regional wall motion abnormalities. The left ventricular internal cavity size was normal in size. There is  moderate left ventricular hypertrophy. Left ventricular diastolic parameters were normal. Right Ventricle: The right ventricular size is normal. No increase in right ventricular wall thickness. Right ventricular systolic function is normal. There is normal pulmonary artery systolic pressure. The tricuspid regurgitant velocity is 2.37 m/s, and  with an assumed right atrial pressure of 8 mmHg, the estimated right ventricular systolic pressure is 30.5 mmHg. Left Atrium: Left atrial size was normal in size. Right Atrium: Right atrial size was normal in size. Pericardium: There is no evidence  of pericardial effusion. Mitral Valve: The mitral valve is normal in structure. Mild mitral valve regurgitation. Tricuspid Valve: The tricuspid valve is normal in structure. Tricuspid valve regurgitation is trivial. Aortic Valve: The aortic valve is tricuspid. Aortic valve regurgitation is trivial. Aortic valve sclerosis is present, with no evidence of aortic valve stenosis. Aortic valve mean gradient measures 4.0 mmHg. Aortic valve peak gradient measures 7.0 mmHg. Aortic valve area, by VTI measures 2.52 cm. Pulmonic Valve: The pulmonic valve was not well visualized. Pulmonic valve regurgitation is trivial. Aorta: The aortic root and ascending aorta are structurally normal, with no evidence of dilitation. Venous: The inferior vena cava is dilated in size with greater than 50% respiratory variability, suggesting right atrial pressure  of 8 mmHg. IAS/Shunts: The interatrial septum was not well visualized.  LEFT VENTRICLE PLAX 2D LVIDd:         4.30 cm   Diastology LVIDs:         3.20 cm   LV e' medial:    0.11 cm/s LV PW:         1.30 cm   LV E/e' medial:  10.6 LV IVS:        1.30 cm   LV e' lateral:   0.09 cm/s LVOT diam:     1.90 cm   LV E/e' lateral: 12.9 LV SV:         61 LV SV Index:   37 LVOT Area:     2.84 cm  RIGHT VENTRICLE             IVC RV S prime:     11.90 cm/s  IVC diam: 1.80 cm TAPSE (M-mode): 2.4 cm LEFT ATRIUM             Index        RIGHT ATRIUM           Index LA Vol (A2C):   59.9 ml 36.36 ml/m  RA Area:     10.50 cm LA Vol (A4C):   46.5 ml 28.23 ml/m  RA Volume:   20.60 ml  12.51 ml/m LA Biplane Vol: 52.8 ml 32.05 ml/m  AORTIC VALVE AV Area (Vmax):    2.62 cm AV Area (Vmean):   2.69 cm AV Area (VTI):     2.52 cm AV Vmax:           132.00 cm/s AV Vmean:          89.100 cm/s AV VTI:            0.243 m AV Peak Grad:      7.0 mmHg AV Mean Grad:      4.0 mmHg LVOT Vmax:         122.00 cm/s LVOT Vmean:        84.400 cm/s LVOT VTI:          0.216 m LVOT/AV VTI ratio: 0.89  AORTA Ao Root diam:  3.50 cm Ao Asc diam:  3.20 cm MITRAL VALVE                  TRICUSPID VALVE MV Area (PHT): 3.72 cm       TR Peak grad:   22.5 mmHg MV Decel Time: 204 msec       TR Vmax:        237.00 cm/s MR Peak grad:    131.3 mmHg MR Mean grad:    97.0 mmHg    SHUNTS MR Vmax:         573.00 cm/s  Systemic VTI:  0.22 m MR Vmean:        469.0 cm/s   Systemic Diam: 1.90 cm MR PISA:         4.02 cm MR PISA Eff ROA: 27 mm MR PISA Radius:  0.80 cm MV E velocity: 1.12 cm/s MV A velocity: 85.90 cm/s MV E/A ratio:  0.01 Epifanio Lesches MD Electronically signed by Epifanio Lesches MD Signature Date/Time: 04/14/2023/5:18:54 PM    Final    CT ABDOMEN PELVIS WO CONTRAST  Result Date: 04/13/2023 CLINICAL DATA:  Acute renal failure, bilateral lower extremity swelling and pain EXAM: CT ABDOMEN AND PELVIS WITHOUT CONTRAST TECHNIQUE: Multidetector CT imaging of the abdomen and pelvis was performed following  the standard protocol without IV contrast. RADIATION DOSE REDUCTION: This exam was performed according to the departmental dose-optimization program which includes automated exposure control, adjustment of the mA and/or kV according to patient size and/or use of iterative reconstruction technique. COMPARISON:  None Available. FINDINGS: Lower chest: No acute abnormality. Hepatobiliary: No solid liver abnormality is seen. Hepatic steatosis. No gallstones, gallbladder wall thickening, or biliary dilatation. Pancreas: Unremarkable. No pancreatic ductal dilatation or surrounding inflammatory changes. Spleen: Normal in size without significant abnormality. Adrenals/Urinary Tract: Adrenal glands are unremarkable. Kidneys are normal, without renal calculi, solid lesion, or hydronephrosis. Bladder is unremarkable. Stomach/Bowel: Stomach is within normal limits. Appendix appears normal. No evidence of bowel wall thickening, distention, or inflammatory changes. Sigmoid diverticulosis. Vascular/Lymphatic: Aortic atherosclerosis. No enlarged  abdominal or pelvic lymph nodes. Reproductive: No mass or other significant abnormality. Other: No abdominal wall hernia or abnormality. No ascites. Musculoskeletal: No acute or significant osseous findings. IMPRESSION: 1. No acute noncontrast CT findings of the abdomen or pelvis to explain acute renal failure. No urinary tract calculi or hydronephrosis. 2. Hepatic steatosis. 3. Sigmoid diverticulosis without evidence of acute diverticulitis. Aortic Atherosclerosis (ICD10-I70.0). Electronically Signed   By: Jearld Lesch M.D.   On: 04/13/2023 09:47   US Venous Img Lower Unilateral Left  Result Date: 04/13/2023 CLINICAL DATA:  Bilateral foot pain Left lower extremity edema EXAM: LEFT LOWER EXTREMITY VENOUS DOPPLER ULTRASOUND TECHNIQUE: Gray-scale sonography with compression, as well as color and duplex ultrasound, were performed to evaluate the deep venous system(s) from the level of the common femoral vein through the popliteal and proximal calf veins. COMPARISON:  None Available. FINDINGS: VENOUS Short segment thrombosis of the peroneal vein in the mid calf which is expanded and noncompressible with no color flow. Normal compressibility of the common femoral, superficial femoral, and popliteal veins. Visualized portions of profunda femoral vein and great saphenous vein unremarkable. No additional filling defects to suggest DVT on grayscale or color Doppler imaging. Doppler waveforms show otherwise normal direction of venous flow, normal respiratory plasticity and response to augmentation. Limited views of the contralateral common femoral vein are unremarkable. OTHER None. Limitations: none IMPRESSION: Short-segment acute thrombosis of the left peroneal vein in the mid calf. No additional left lower extremity DVT. Electronically Signed   By: Acquanetta Belling M.D.   On: 04/13/2023 09:46   DG Chest Portable 1 View  Result Date: 04/13/2023 CLINICAL DATA:  Three day history of bilateral lower extremity swelling and  pain EXAM: PORTABLE CHEST 1 VIEW COMPARISON:  Chest radiograph dated 08/06/2018 FINDINGS: Normal lung volumes. No focal consolidations. No pleural effusion or pneumothorax. The heart size and mediastinal contours are within normal limits. No acute osseous abnormality. IMPRESSION: No active disease. Electronically Signed   By: Agustin Cree M.D.   On: 04/13/2023 09:44     TODAY-DAY OF DISCHARGE:  Subjective:   Timothy Chambers today has no headache,no chest abdominal pain,no new weakness tingling or numbness, feels much better wants to go home today.  Objective:   Blood pressure (!) 166/85, pulse 94, temperature 98.3 F (36.8 C), temperature source Oral, resp. rate 20, height 5\' 5"  (1.651 m), weight 59 kg, SpO2 95 %.  Intake/Output Summary (Last 24 hours) at 04/17/2023 0903 Last data filed at 04/16/2023 1523 Gross per 24 hour  Intake 2450.67 ml  Output 100 ml  Net 2350.67 ml   Filed Weights   04/13/23 0624  Weight: 59 kg    Exam: Awake Alert, Oriented *3, No new F.N deficits, Normal affect [place].AT,PERRAL  Supple Neck,No JVD, No cervical lymphadenopathy appriciated.  Symmetrical Chest wall movement, Good air movement bilaterally, CTAB RRR,No Gallops,Rubs or new Murmurs, No Parasternal Heave +ve B.Sounds, Abd Soft, Non tender, No organomegaly appriciated, No rebound -guarding or rigidity. No Cyanosis, Clubbing or edema, No new Rash or bruise   PERTINENT RADIOLOGIC STUDIES: No results found.   PERTINENT LAB RESULTS: CBC: Recent Labs    04/16/23 0419 04/17/23 0314  WBC 11.1* 12.5*  HGB 8.8* 8.6*  HCT 25.9* 25.2*  PLT 399 400   CMET CMP     Component Value Date/Time   NA 137 04/17/2023 0314   K 3.3 (L) 04/17/2023 0314   CL 108 04/17/2023 0314   CO2 18 (L) 04/17/2023 0314   GLUCOSE 88 04/17/2023 0314   BUN 29 (H) 04/17/2023 0314   CREATININE 3.35 (H) 04/17/2023 0314   CALCIUM 7.7 (L) 04/17/2023 0314   PROT 5.1 (L) 04/16/2023 0419   ALBUMIN 1.9 (L) 04/16/2023 0419   AST  57 (H) 04/16/2023 0419   ALT 45 (H) 04/16/2023 0419   ALKPHOS 286 (H) 04/16/2023 0419   BILITOT 0.8 04/16/2023 0419   GFRNONAA 20 (L) 04/17/2023 0314    GFR Estimated Creatinine Clearance: 18.3 mL/min (A) (by C-G formula based on SCr of 3.35 mg/dL (H)). No results for input(s): "LIPASE", "AMYLASE" in the last 72 hours. No results for input(s): "CKTOTAL", "CKMB", "CKMBINDEX", "TROPONINI" in the last 72 hours. Invalid input(s): "POCBNP" No results for input(s): "DDIMER" in the last 72 hours. No results for input(s): "HGBA1C" in the last 72 hours. No results for input(s): "CHOL", "HDL", "LDLCALC", "TRIG", "CHOLHDL", "LDLDIRECT" in the last 72 hours. No results for input(s): "TSH", "T4TOTAL", "T3FREE", "THYROIDAB" in the last 72 hours.  Invalid input(s): "FREET3" No results for input(s): "VITAMINB12", "FOLATE", "FERRITIN", "TIBC", "IRON", "RETICCTPCT" in the last 72 hours. Coags: Recent Labs    04/16/23 0419  INR 1.1   Microbiology: No results found for this or any previous visit (from the past 240 hour(s)).  FURTHER DISCHARGE INSTRUCTIONS:  Get Medicines reviewed and adjusted: Please take all your medications with you for your next visit with your Primary MD  Laboratory/radiological data: Please request your Primary MD to go over all hospital tests and procedure/radiological results at the follow up, please ask your Primary MD to get all Hospital records sent to his/her office.  In some cases, they will be blood work, cultures and biopsy results pending at the time of your discharge. Please request that your primary care M.D. goes through all the records of your hospital data and follows up on these results.  Also Note the following: If you experience worsening of your admission symptoms, develop shortness of breath, life threatening emergency, suicidal or homicidal thoughts you must seek medical attention immediately by calling 911 or calling your MD immediately  if symptoms less  severe.  You must read complete instructions/literature along with all the possible adverse reactions/side effects for all the Medicines you take and that have been prescribed to you. Take any new Medicines after you have completely understood and accpet all the possible adverse reactions/side effects.   Do not drive when taking Pain medications or sleeping medications (Benzodaizepines)  Do not take more than prescribed Pain, Sleep and Anxiety Medications. It is not advisable to combine anxiety,sleep and pain medications without talking with your primary care practitioner  Special Instructions: If you have smoked or chewed Tobacco  in the last 2 yrs please stop smoking, stop any regular Alcohol  and  or any Recreational drug use.  Wear Seat belts while driving.  Please note: You were cared for by a hospitalist during your hospital stay. Once you are discharged, your primary care physician will handle any further medical issues. Please note that NO REFILLS for any discharge medications will be authorized once you are discharged, as it is imperative that you return to your primary care physician (or establish a relationship with a primary care physician if you do not have one) for your post hospital discharge needs so that they can reassess your need for medications and monitor your lab values.  Total Time spent coordinating discharge including counseling, education and face to face time equals greater than 30 minutes.  SignedJeoffrey Massed 04/17/2023 9:03 AM

## 2023-04-17 NOTE — Progress Notes (Signed)
Patient ID: Timothy Chambers, male   DOB: 1956-03-27, 67 y.o.   MRN: [id number] S: Pt left AMA O:BP (!) 166/85 (BP Location: Right Arm)   Pulse 94   Temp 98.3 F (36.8 C) (Oral)   Resp 20   Ht 5\' 5"  (1.651 m)   Wt 59 kg   SpO2 95%   BMI 21.63 kg/m   Intake/Output Summary (Last 24 hours) at 04/17/2023 0845 Last data filed at 04/16/2023 1523 Gross per 24 hour  Intake 2450.67 ml  Output 400 ml  Net 2050.67 ml   Intake/Output: I/O last 3 completed shifts: In: 2450.7 [I.V.:2348.9; IV Piggyback:101.8] Out: 550 [Urine:550]  Intake/Output this shift:  No intake/output data recorded. Weight change:    Recent Labs  Lab 04/13/23 0748 04/14/23 0732 04/15/23 0155 04/16/23 0419 04/17/23 0314  NA 140 140 141 141 137  K 2.6* 3.4* 4.5 3.3* 3.3*  CL 111 118* 117* 114* 108  CO2 16* 13* 9* 16* 18*  GLUCOSE 103* 98 156* 91 88  BUN 57* 44* 42* 32* 29*  CREATININE 4.28* 3.90* 3.83* 3.54* 3.35*  ALBUMIN 2.6*  --  2.1* 1.9*  --   CALCIUM 7.6* 7.2* 7.8* 7.7* 7.7*  AST 34  --  97* 57*  --   ALT 23  --  57* 45*  --    Liver Function Tests: Recent Labs  Lab 04/13/23 0748 04/15/23 0155 04/16/23 0419  AST 34 97* 57*  ALT 23 57* 45*  ALKPHOS 125 290* 286*  BILITOT 0.6 0.7 0.8  PROT 6.4* 5.7* 5.1*  ALBUMIN 2.6* 2.1* 1.9*   No results for input(s): "LIPASE", "AMYLASE" in the last 168 hours. No results for input(s): "AMMONIA" in the last 168 hours. CBC: Recent Labs  Lab 04/13/23 0748 04/13/23 0810 04/14/23 0732 04/14/23 1805 04/15/23 0155 04/16/23 0419 04/17/23 0314  WBC 12.1* 14.0* 12.5*  --  17.4* 11.1* 12.5*  NEUTROABS 10.0* 11.4*  --   --   --  9.4*  --   HGB 6.3* 6.5* 5.5*   < > 9.4* 8.8* 8.6*  HCT 18.9* 19.3* 17.2*   < > 28.9* 25.9* 25.2*  MCV 94.5 95.5 97.2  --  96.0 92.5 91.3  PLT 531* 526* 436*  --  449* 399 400   < > = values in this interval not displayed.   Cardiac Enzymes: No results for input(s): "CKTOTAL", "CKMB", "CKMBINDEX", "TROPONINI" in the last 168  hours. CBG: No results for input(s): "GLUCAP" in the last 168 hours.  Iron Studies: No results for input(s): "IRON", "TIBC", "TRANSFERRIN", "FERRITIN" in the last 72 hours. Studies/Results: No results found.  sodium chloride   Intravenous Once   amLODipine  10 mg Oral Daily   bisacodyl  10 mg Oral Daily   carvedilol  6.25 mg Oral BID WC   feeding supplement  237 mL Oral BID BM   lidocaine  20 mL Intradermal Once   lidocaine  20 mL Infiltration Once   pantoprazole  40 mg Oral BID   potassium chloride  40 mEq Oral Once   sucralfate  1 g Oral BID    BMET    Component Value Date/Time   NA 137 04/17/2023 0314   K 3.3 (L) 04/17/2023 0314   CL 108 04/17/2023 0314   CO2 18 (L) 04/17/2023 0314   GLUCOSE 88 04/17/2023 0314   BUN 29 (H) 04/17/2023 0314   CREATININE 3.35 (H) 04/17/2023 0314   CALCIUM 7.7 (L) 04/17/2023 0314   GFRNONAA 20 (L)  04/17/2023 0314   GFRAA 50 (L) 08/06/2018 1125   CBC    Component Value Date/Time   WBC 12.5 (H) 04/17/2023 0314   RBC 2.76 (L) 04/17/2023 0314   HGB 8.6 (L) 04/17/2023 0314   HCT 25.2 (L) 04/17/2023 0314   PLT 400 04/17/2023 0314   MCV 91.3 04/17/2023 0314   MCH 31.2 04/17/2023 0314   MCHC 34.1 04/17/2023 0314   RDW 15.9 (H) 04/17/2023 0314   LYMPHSABS 0.7 04/16/2023 0419   MONOABS 0.9 04/16/2023 0419   EOSABS 0.1 04/16/2023 0419   BASOSABS 0.0 04/16/2023 0419     Assessment/Plan:  AKI/CKD stage IIIa vs progressive CKD stage IV - unknown baseline Scr as no labs since 2019.  Current presentation, likely ischemic ATN in setting of poor po intake and symptomatic ABLA with Hgb of 6.5.  Scr mildly improved to 3.83 and now 3.54 this morning.  Hopefully will continue to improve although this may be his new baseline.  CT of abd and pelvis without obstruction.  UA with small blood and protein.  No uremic symptoms.  No indication for dialysis at this time.  Will follow UOP and SCr after he receives blood transfusion.  GN workup with negative ANA,  ANCA, dsDNA, ASO and normal complements.   He will need to f/u in our office in 4-6 weeks. Avoid nephrotoxic medications including NSAIDs and iodinated intravenous contrast exposure unless the latter is absolutely indicated.  Preferred narcotic agents for pain control are hydromorphone, fentanyl, and methadone. Morphine should not be used. Avoid Baclofen and avoid oral sodium phosphate and magnesium citrate based laxatives / bowel preps. Continue strict Input and Output monitoring. Will monitor the patient closely with you and intervene or adjust therapy as indicated by changes in clinical status/labs  ABLA - had episode of melena at [facility], heme +.  S/p blood transfusion with increased Hgb to 9.4.  GI evaluation underway. Left peroneal vein thrombosis - cannot use anticoagulation given GIB, s/p IVC filter.  Workup per primary svc. Hypokalemia - replete and follow.   Hypomagnesium - replete and follow.  Abnormal LFT's - jump in AST/ALT.  Workup per primary svc. Etoh abuse - he admits to drinking again but thinks the last drink was 2 weeks ago, although he said he drinks so he can sleep due to the pain in his feet which just started this week.  Irena Cords, MD [facility]

## 2023-04-20 ENCOUNTER — Encounter (HOSPITAL_COMMUNITY): Payer: Self-pay | Admitting: Gastroenterology

## 2023-04-21 LAB — SURGICAL PATHOLOGY

## 2023-04-23 ENCOUNTER — Other Ambulatory Visit (HOSPITAL_BASED_OUTPATIENT_CLINIC_OR_DEPARTMENT_OTHER): Payer: Self-pay

## 2023-04-23 ENCOUNTER — Other Ambulatory Visit: Payer: Self-pay | Admitting: *Deleted

## 2023-04-23 ENCOUNTER — Encounter: Payer: Self-pay | Admitting: Gastroenterology

## 2023-04-23 MED ORDER — FLUCONAZOLE 100 MG PO TABS
ORAL_TABLET | ORAL | 0 refills | Status: AC
  Filled 2023-04-23: qty 15, 14d supply, fill #0

## 2023-04-24 ENCOUNTER — Other Ambulatory Visit: Payer: Self-pay

## 2023-04-24 ENCOUNTER — Emergency Department (HOSPITAL_BASED_OUTPATIENT_CLINIC_OR_DEPARTMENT_OTHER)
Admission: EM | Admit: 2023-04-24 | Discharge: 2023-04-24 | Disposition: A | Discharge status: Home / Self Care | Payer: Medicare HMO | Attending: Emergency Medicine | Admitting: Emergency Medicine

## 2023-04-24 ENCOUNTER — Emergency Department (HOSPITAL_BASED_OUTPATIENT_CLINIC_OR_DEPARTMENT_OTHER): Payer: Medicare HMO

## 2023-04-24 ENCOUNTER — Encounter (HOSPITAL_BASED_OUTPATIENT_CLINIC_OR_DEPARTMENT_OTHER): Payer: Self-pay

## 2023-04-24 DIAGNOSIS — D649 Anemia, unspecified: Secondary | ICD-10-CM | POA: Insufficient documentation

## 2023-04-24 DIAGNOSIS — R9431 Abnormal electrocardiogram [ECG] [EKG]: Secondary | ICD-10-CM | POA: Insufficient documentation

## 2023-04-24 DIAGNOSIS — I129 Hypertensive chronic kidney disease with stage 1 through stage 4 chronic kidney disease, or unspecified chronic kidney disease: Secondary | ICD-10-CM | POA: Insufficient documentation

## 2023-04-24 DIAGNOSIS — Z79899 Other long term (current) drug therapy: Secondary | ICD-10-CM | POA: Diagnosis not present

## 2023-04-24 DIAGNOSIS — N189 Chronic kidney disease, unspecified: Secondary | ICD-10-CM | POA: Insufficient documentation

## 2023-04-24 DIAGNOSIS — I82452 Acute embolism and thrombosis of left peroneal vein: Secondary | ICD-10-CM | POA: Diagnosis not present

## 2023-04-24 DIAGNOSIS — Z7901 Long term (current) use of anticoagulants: Secondary | ICD-10-CM | POA: Insufficient documentation

## 2023-04-24 DIAGNOSIS — R600 Localized edema: Secondary | ICD-10-CM | POA: Insufficient documentation

## 2023-04-24 DIAGNOSIS — R778 Other specified abnormalities of plasma proteins: Secondary | ICD-10-CM | POA: Diagnosis not present

## 2023-04-24 DIAGNOSIS — R7989 Other specified abnormal findings of blood chemistry: Secondary | ICD-10-CM | POA: Insufficient documentation

## 2023-04-24 DIAGNOSIS — R918 Other nonspecific abnormal finding of lung field: Secondary | ICD-10-CM | POA: Diagnosis not present

## 2023-04-24 DIAGNOSIS — R079 Chest pain, unspecified: Secondary | ICD-10-CM | POA: Diagnosis not present

## 2023-04-24 DIAGNOSIS — I700 Atherosclerosis of aorta: Secondary | ICD-10-CM | POA: Diagnosis not present

## 2023-04-24 DIAGNOSIS — R0789 Other chest pain: Secondary | ICD-10-CM | POA: Diagnosis not present

## 2023-04-24 DIAGNOSIS — M79672 Pain in left foot: Secondary | ICD-10-CM | POA: Diagnosis not present

## 2023-04-24 DIAGNOSIS — M7989 Other specified soft tissue disorders: Secondary | ICD-10-CM | POA: Diagnosis not present

## 2023-04-24 HISTORY — DX: Gastrointestinal hemorrhage, unspecified: K92.2

## 2023-04-24 HISTORY — DX: Acute embolism and thrombosis of unspecified deep veins of unspecified lower extremity: I82.409

## 2023-04-24 HISTORY — DX: Anemia, unspecified: D64.9

## 2023-04-24 LAB — CBC WITH DIFFERENTIAL/PLATELET
Abs Immature Granulocytes: 0.06 10*3/uL (ref 0.00–0.07)
Basophils Absolute: 0.1 10*3/uL (ref 0.0–0.1)
Basophils Relative: 1 %
Eosinophils Absolute: 0.2 10*3/uL (ref 0.0–0.5)
Eosinophils Relative: 2 %
HCT: 27.6 % — ABNORMAL LOW (ref 39.0–52.0)
Hemoglobin: 9 g/dL — ABNORMAL LOW (ref 13.0–17.0)
Immature Granulocytes: 1 %
Lymphocytes Relative: 13 %
Lymphs Abs: 1.3 10*3/uL (ref 0.7–4.0)
MCH: 30.6 pg (ref 26.0–34.0)
MCHC: 32.6 g/dL (ref 30.0–36.0)
MCV: 93.9 fL (ref 80.0–100.0)
Monocytes Absolute: 1.1 10*3/uL — ABNORMAL HIGH (ref 0.1–1.0)
Monocytes Relative: 11 %
Neutro Abs: 7.7 10*3/uL (ref 1.7–7.7)
Neutrophils Relative %: 72 %
Platelets: 552 10*3/uL — ABNORMAL HIGH (ref 150–400)
RBC: 2.94 MIL/uL — ABNORMAL LOW (ref 4.22–5.81)
RDW: 15.9 % — ABNORMAL HIGH (ref 11.5–15.5)
WBC: 10.4 10*3/uL (ref 4.0–10.5)
nRBC: 0 % (ref 0.0–0.2)

## 2023-04-24 LAB — COMPREHENSIVE METABOLIC PANEL
ALT: 29 U/L (ref 0–44)
AST: 43 U/L — ABNORMAL HIGH (ref 15–41)
Albumin: 2.4 g/dL — ABNORMAL LOW (ref 3.5–5.0)
Alkaline Phosphatase: 191 U/L — ABNORMAL HIGH (ref 38–126)
Anion gap: 11 (ref 5–15)
BUN: 29 mg/dL — ABNORMAL HIGH (ref 8–23)
CO2: 19 mmol/L — ABNORMAL LOW (ref 22–32)
Calcium: 7.6 mg/dL — ABNORMAL LOW (ref 8.9–10.3)
Chloride: 109 mmol/L (ref 98–111)
Creatinine, Ser: 3.26 mg/dL — ABNORMAL HIGH (ref 0.61–1.24)
GFR, Estimated: 20 mL/min — ABNORMAL LOW (ref >60)
Glucose, Bld: 109 mg/dL — ABNORMAL HIGH (ref 70–99)
Potassium: 3.5 mmol/L (ref 3.5–5.1)
Sodium: 139 mmol/L (ref 135–145)
Total Bilirubin: 0.5 mg/dL (ref 0.3–1.2)
Total Protein: 6.2 g/dL — ABNORMAL LOW (ref 6.5–8.1)

## 2023-04-24 LAB — TROPONIN I (HIGH SENSITIVITY)
Troponin I (High Sensitivity): 58 ng/L — ABNORMAL HIGH (ref <18)
Troponin I (High Sensitivity): 44 ng/L — ABNORMAL HIGH (ref <18)

## 2023-04-24 LAB — BRAIN NATRIURETIC PEPTIDE: B Natriuretic Peptide: 1064.8 pg/mL — ABNORMAL HIGH (ref 0.0–100.0)

## 2023-04-24 MED ORDER — FUROSEMIDE 20 MG PO TABS: 20.0000 mg | ORAL_TABLET | Freq: Every day | ORAL | 0 refills | Status: AC

## 2023-04-24 MED ORDER — FUROSEMIDE 10 MG/ML IJ SOLN
40.0000 mg | Freq: Once | INTRAMUSCULAR | Status: AC
  Administered 2023-04-24: 40 mg via INTRAVENOUS
  Filled 2023-04-24: qty 4

## 2023-04-24 MED ORDER — APIXABAN 5 MG PO TABS: 5.0000 mg | ORAL_TABLET | Freq: Two times a day (BID) | ORAL | 0 refills | Status: AC

## 2023-04-24 NOTE — ED Notes (Signed)
Fall risk armband  fall risk sign

## 2023-04-24 NOTE — ED Triage Notes (Signed)
Patient was DC from the hospital Saturday. He had a DVT and filter place. He stated is legs and feet are swollen and hurting.

## 2023-04-24 NOTE — Discharge Instructions (Addendum)
Your chest pain resolved in the emergency department.  Your lower extremity swelling is due to mild fluid overload.  You just had an inpatient echocardiogram which did not show evidence of heart failure.  Your kidney function is at baseline.  Recommend a 5-day course of Lasix and follow-up with your primary care physician on Tuesday per your scheduled appointment.  Follow-up with your PCP for consideration for outpatient stress testing given your chest discomfort today that has resolved.  Your cardiac enzymes were mildly elevated but downtrending.  We will start you on Eliquis given your lower extremity blood clot.  This has been sent to your pharmacy.

## 2023-04-24 NOTE — ED Provider Notes (Signed)
[place] [facility] AT [facility] Provider Note   CSN: [id number] Arrival date & time: 04/24/23  4098     History  Chief Complaint  Patient presents with   Leg Swelling    Timothy Chambers is a 67 y.o. male.  HPI   67 year old male with medical history significant for HTN, CKD, recent GI bleed, left lower extremity DVT status post IVC filter placement during her recent hospitalization from 04/13/2023 to 04/17/2023 presenting to the emergency department with bilateral lower extremity swelling and chest discomfort.  The patient states that he has noticed worsening lower extremity swelling bilaterally.  On chart review, the patient initially was unable to be anticoagulated during his recent hospital admission due to the concern for acute blood loss anemia in the setting of a GI bleed.  He received blood transfusions in the hospital with subsequent improvement in his hemoglobin, had an EGD and colonoscopy that was unremarkable.  He had been planned to be started on IV heparin on 7/6 and then subsequently transition him safely to Eliquis however the patient left the hospital against the advice of his inpatient doctors prior to being anticoagulated.  He is currently not anticoagulated but has an IVC filter in place.  He states that he has felt a dull ache in the left side of his chest over the last 2 days.  No radiation of the pain.  He describes it as a mild dull twinge.  No ripping tearing component, he denies any shortness of breath.  Home Medications Prior to Admission medications   Medication Sig Start Date End Date Taking? Authorizing Provider  apixaban (ELIQUIS) 5 MG TABS tablet Take 1 tablet (5 mg total) by mouth 2 (two) times daily. 04/24/23 05/24/23 Yes Ernie Avena, MD  furosemide (LASIX) 20 MG tablet Take 1 tablet (20 mg total) by mouth daily for 5 days. 04/24/23 04/29/23 Yes Ernie Avena, MD  amLODipine (NORVASC) 10 MG tablet Take 1 tablet (10 mg total) by mouth  daily. 04/17/23   Ghimire, Werner Lean, MD  carvedilol (COREG) 6.25 MG tablet Take 1 tablet (6.25 mg total) by mouth 2 (two) times daily with a meal. 04/17/23   Ghimire, Werner Lean, MD  feeding supplement (ENSURE ENLIVE / ENSURE PLUS) LIQD Take 237 mLs by mouth 2 (two) times daily between meals. 04/17/23 05/17/23  Ghimire, Werner Lean, MD  fluconazole (DIFLUCAN) 100 MG tablet Take 2 tablet (200 mg) by mouth on day 1, then decrease to 1 tablet (100 mg) by mouth once daily on days 2-14 04/23/23   Mansouraty, Netty Starring., MD  pantoprazole (PROTONIX) 40 MG tablet Take 1 tablet (40 mg total) by mouth 2 (two) times daily. 04/17/23   Ghimire, Werner Lean, MD  polycarbophil (FIBERCON) 625 MG tablet Take 1 tablet (625 mg total) by mouth daily. 04/17/23 04/16/24  Ghimire, Werner Lean, MD  sodium bicarbonate 650 MG tablet Take 1 tablet (650 mg total) by mouth 2 (two) times daily. 04/17/23 04/16/24  Ghimire, Werner Lean, MD      Allergies    Norco [hydrocodone-acetaminophen]    Review of Systems   Review of Systems  All other systems reviewed and are negative.   Physical Exam Updated Vital Signs BP (!) 154/85   Pulse 77   Temp 98.2 F (36.8 C)   Resp 15   Ht 5\' 5"  (1.651 m)   Wt 54.9 kg   SpO2 96%   BMI 20.14 kg/m  Physical Exam Vitals and nursing note reviewed.  Constitutional:  General: He is not in acute distress.    Appearance: He is well-developed.  HENT:     Head: Normocephalic and atraumatic.  Eyes:     Conjunctiva/sclera: Conjunctivae normal.  Cardiovascular:     Rate and Rhythm: Normal rate and regular rhythm.  Pulmonary:     Effort: Pulmonary effort is normal. No respiratory distress.     Breath sounds: Normal breath sounds.  Abdominal:     Palpations: Abdomen is soft.     Tenderness: There is no abdominal tenderness.  Musculoskeletal:     Cervical back: Neck supple.     Right lower leg: Edema present.     Left lower leg: Edema present.     Comments: 2+ pitting edema bilaterally from the ankles  to the mid calves  Skin:    General: Skin is warm and dry.     Capillary Refill: Capillary refill takes less than 2 seconds.  Neurological:     Mental Status: He is alert.  Psychiatric:        Mood and Affect: Mood normal.     ED Results / Procedures / Treatments   Labs (all labs ordered are listed, but only abnormal results are displayed) Labs Reviewed  CBC WITH DIFFERENTIAL/PLATELET - Abnormal; Notable for the following components:      Result Value   RBC 2.94 (*)    Hemoglobin 9.0 (*)    HCT 27.6 (*)    RDW 15.9 (*)    Platelets 552 (*)    Monocytes Absolute 1.1 (*)    All other components within normal limits  COMPREHENSIVE METABOLIC PANEL - Abnormal; Notable for the following components:   CO2 19 (*)    Glucose, Bld 109 (*)    BUN 29 (*)    Creatinine, Ser 3.26 (*)    Calcium 7.6 (*)    Total Protein 6.2 (*)    Albumin 2.4 (*)    AST 43 (*)    Alkaline Phosphatase 191 (*)    GFR, Estimated 20 (*)    All other components within normal limits  BRAIN NATRIURETIC PEPTIDE - Abnormal; Notable for the following components:   B Natriuretic Peptide 1,064.8 (*)    All other components within normal limits  TROPONIN I (HIGH SENSITIVITY) - Abnormal; Notable for the following components:   Troponin I (High Sensitivity) 58 (*)    All other components within normal limits  TROPONIN I (HIGH SENSITIVITY) - Abnormal; Notable for the following components:   Troponin I (High Sensitivity) 44 (*)    All other components within normal limits    EKG EKG Interpretation Date/Time:  Saturday April 24 2023 10:49:01 EDT Ventricular Rate:  82 PR Interval:  140 QRS Duration:  90 QT Interval:  456 QTC Calculation: 533 R Axis:   -34  Text Interpretation: Sinus rhythm Probable left atrial enlargement Left axis deviation Anterior infarct, old Prolonged QT interval Confirmed by Ernie Avena ([id number]) on 04/24/2023 10:54:47 AM  Radiology US Venous Img Lower Bilateral  Result Date:  04/24/2023 CLINICAL DATA:  Bilateral lower extremity and foot pain and swelling. History of recent left peroneal vein DVT and IVC filter placement. EXAM: BILATERAL LOWER EXTREMITY VENOUS DOPPLER ULTRASOUND TECHNIQUE: Gray-scale sonography with graded compression, as well as color Doppler and duplex ultrasound were performed to evaluate the lower extremity deep venous systems from the level of the common femoral vein and including the common femoral, femoral, profunda femoral, popliteal and calf veins including the posterior tibial, peroneal and gastrocnemius veins  when visible. The superficial great saphenous vein was also interrogated. Spectral Doppler was utilized to evaluate flow at rest and with distal augmentation maneuvers in the common femoral, femoral and popliteal veins. COMPARISON:  Left lower extremity venous duplex on 04/13/2023 FINDINGS: RIGHT LOWER EXTREMITY Common Femoral Vein: No evidence of acute thrombus. There clearly is a component of mural thickening of the vein implicating prior thrombosis. The venous lumen is clearly patent currently with good flow present. Normal compressibility, respiratory phasicity and response to augmentation. Saphenofemoral Junction: No evidence of thrombus. Normal compressibility and flow on color Doppler imaging. Profunda Femoral Vein: No evidence of acute thrombus. Mural thickening and web formation noted, again implicating prior DVT. Normal compressibility and flow on color Doppler imaging. Femoral Vein: No evidence of thrombus. No significant mural thickening or chronic thrombus in the right femoral vein. Normal compressibility, respiratory phasicity and response to augmentation. Popliteal Vein: No evidence of thrombus. No mural thickening or chronic thrombus. Normal compressibility, respiratory phasicity and response to augmentation. Calf Veins: No evidence of thrombus. Normal compressibility and flow on color Doppler imaging. Superficial Great Saphenous Vein: No  evidence of thrombus. Normal compressibility. Venous Reflux:  None. Other Findings: No evidence of superficial thrombophlebitis or abnormal fluid collection. LEFT LOWER EXTREMITY Common Femoral Vein: No evidence of thrombus. Normal compressibility, respiratory phasicity and response to augmentation. Saphenofemoral Junction: No evidence of thrombus. Normal compressibility and flow on color Doppler imaging. Profunda Femoral Vein: No evidence of thrombus. Normal compressibility and flow on color Doppler imaging. Femoral Vein: No evidence of thrombus. Normal compressibility, respiratory phasicity and response to augmentation. Popliteal Vein: No evidence of acute thrombus. There may be some mild mural thickening of the mid to distal left popliteal vein potentially on the basis of prior thrombosis. The popliteal vein demonstrates good flow and patency. Normal compressibility, respiratory phasicity and response to augmentation. Calf Veins: Residual largely occlusive thrombus identified in the visualized left peroneal vein. There is some flow present in 1 segment the peroneal vein. The left posterior tibial veins are normally patent. Superficial Great Saphenous Vein: No evidence of thrombus. Normal compressibility. Venous Reflux:  None. Other Findings: No evidence of superficial thrombophlebitis or abnormal fluid collection. IMPRESSION: 1. Residual mostly occlusive thrombus in the left peroneal vein. No other left-sided acute DVT. 2. No evidence of acute deep vein thrombosis in the right lower extremity. 3. Evidence of probable prior DVT in the right common femoral vein and right profunda femoral vein with some chronic mural thickening present. There is good flow and patency of these segments currently. 4. Mild mural thickening of the mid to distal left popliteal vein potentially on the basis of prior DVT. Electronically Signed   By: Irish Lack M.D.   On: 04/24/2023 09:50   DG Chest 2 View  Result Date:  04/24/2023 CLINICAL DATA:  67 year old male with history of chest pain. EXAM: CHEST - 2 VIEW COMPARISON:  Chest x-ray 04/13/2023. FINDINGS: New bibasilar opacities may reflect areas of atelectasis and/or consolidation. Small bilateral pleural effusions (left-greater-than-right), new compared to the prior study. No pneumothorax. No evidence of pulmonary edema. Heart size is upper limits of normal. Upper mediastinal contours are within normal limits. Atherosclerotic calcifications are noted in the thoracic aorta. IMPRESSION: 1. Worsening aeration in the lung bases with new bibasilar areas of atelectasis and/or consolidation and small bilateral pleural effusions. 2. Aortic atherosclerosis. Electronically Signed   By: Trudie Reed M.D.   On: 04/24/2023 08:45    Procedures Procedures    Medications Ordered  in ED Medications  furosemide (LASIX) injection 40 mg (40 mg Intravenous Given 04/24/23 1023)    ED Course/ Medical Decision Making/ A&P             HEART Score: 5                Medical Decision Making Amount and/or Complexity of Data Reviewed Labs: ordered. Radiology: ordered.  Risk Prescription drug management.   67 year old male with medical history significant for HTN, CKD, recent GI bleed, left lower extremity DVT status post IVC filter placement during her recent hospitalization from 04/13/2023 to 04/17/2023 presenting to the emergency department with bilateral lower extremity swelling and chest discomfort.  The patient states that he has noticed worsening lower extremity swelling bilaterally.  On chart review, the patient initially was unable to be anticoagulated during his recent hospital admission due to the concern for acute blood loss anemia in the setting of a GI bleed.  He received blood transfusions in the hospital with subsequent improvement in his hemoglobin, had an EGD and colonoscopy that was unremarkable.  He had been planned to be started on IV heparin on 7/6 and then  subsequently transition him safely to Eliquis however the patient left the hospital against the advice of his inpatient doctors prior to being anticoagulated.  He is currently not anticoagulated but has an IVC filter in place.  He states that he has felt a dull ache in the left side of his chest over the last 2 days.  No radiation of the pain.  He describes it as a mild dull twinge.  No ripping tearing component, he denies any shortness of breath.   Medical Decision Making: Damarie Breig is a 67 y.o. male who presented to the ED today with chest pain, detailed above.  Based on patient's comorbidities, patient has a heart score of 5.    Patient's presentation is complicated by their history of ESRD, GI bleed, DVT.  Patient placed on continuous vitals and telemetry monitoring while in ED which was reviewed periodically.  Complete initial physical exam performed, notably the patient was with evidence of bilateral lower extremity edema, lungs clear to auscultation bilaterally, no rales.   Reviewed and confirmed nursing documentation for past medical history, family history, social history.    Initial Assessment: With the patient's presentation of left-sided chest pain, most likely diagnosis is musculoskeletal chest pain versus GERD, although ACS remains on the differential. Other diagnoses were considered including (but not limited to) pulmonary embolism, community-acquired pneumonia, aortic dissection, pneumothorax, underlying bony abnormality, anemia. These are considered less likely due to history of present illness and physical exam findings.     Regarding the patient's chest discomfort, considered ACS, hypertension, hypervolemia, low concern for PE in the setting of the patient's now IVC filter in place.  Considered new onset CHF however the patient just recently within the last week had an echocardiogram that was normal without diastolic dysfunction.  Considered volume overload in the setting of the  patient's CKD after fluid resuscitation in the hospital.  Initial Plan: Evaluate for ACS with delta troponin and EKG evaluated as below  Evaluate for dissection, bony abnormality, or pneumonia with chest x-ray and screening laboratory evaluation including CBC, BMP  BNP for hypervolemia Further evaluation for Thoracic Aortic Dissection not indicated at this time based on patient's clinical history and PE findings.   Initial Study Results: EKG was reviewed independently. Rate, rhythm, axis, intervals all examined and without medically relevant abnormality. ST segments without concerns  for elevations.  Old infarct present with Q waves anteriorly, prolonged QTc to 533 noted.  Laboratory  Delta  troponin demonstrated elevated but downtrending values from 58-44.  These are downtrending from the patient's previous hospitalization when it was in the 60s.  His BNP was also elevated to 1065 but this is also downtrending from his inpatient hospitalization at 1515.    CBC and BMP without obvious metabolic or inflammatory abnormalities requiring further evaluation   Radiology  US Venous Img Lower Bilateral  Result Date: 04/24/2023 CLINICAL DATA:  Bilateral lower extremity and foot pain and swelling. History of recent left peroneal vein DVT and IVC filter placement. EXAM: BILATERAL LOWER EXTREMITY VENOUS DOPPLER ULTRASOUND TECHNIQUE: Gray-scale sonography with graded compression, as well as color Doppler and duplex ultrasound were performed to evaluate the lower extremity deep venous systems from the level of the common femoral vein and including the common femoral, femoral, profunda femoral, popliteal and calf veins including the posterior tibial, peroneal and gastrocnemius veins when visible. The superficial great saphenous vein was also interrogated. Spectral Doppler was utilized to evaluate flow at rest and with distal augmentation maneuvers in the common femoral, femoral and popliteal veins. COMPARISON:   Left lower extremity venous duplex on 04/13/2023 FINDINGS: RIGHT LOWER EXTREMITY Common Femoral Vein: No evidence of acute thrombus. There clearly is a component of mural thickening of the vein implicating prior thrombosis. The venous lumen is clearly patent currently with good flow present. Normal compressibility, respiratory phasicity and response to augmentation. Saphenofemoral Junction: No evidence of thrombus. Normal compressibility and flow on color Doppler imaging. Profunda Femoral Vein: No evidence of acute thrombus. Mural thickening and web formation noted, again implicating prior DVT. Normal compressibility and flow on color Doppler imaging. Femoral Vein: No evidence of thrombus. No significant mural thickening or chronic thrombus in the right femoral vein. Normal compressibility, respiratory phasicity and response to augmentation. Popliteal Vein: No evidence of thrombus. No mural thickening or chronic thrombus. Normal compressibility, respiratory phasicity and response to augmentation. Calf Veins: No evidence of thrombus. Normal compressibility and flow on color Doppler imaging. Superficial Great Saphenous Vein: No evidence of thrombus. Normal compressibility. Venous Reflux:  None. Other Findings: No evidence of superficial thrombophlebitis or abnormal fluid collection. LEFT LOWER EXTREMITY Common Femoral Vein: No evidence of thrombus. Normal compressibility, respiratory phasicity and response to augmentation. Saphenofemoral Junction: No evidence of thrombus. Normal compressibility and flow on color Doppler imaging. Profunda Femoral Vein: No evidence of thrombus. Normal compressibility and flow on color Doppler imaging. Femoral Vein: No evidence of thrombus. Normal compressibility, respiratory phasicity and response to augmentation. Popliteal Vein: No evidence of acute thrombus. There may be some mild mural thickening of the mid to distal left popliteal vein potentially on the basis of prior thrombosis.  The popliteal vein demonstrates good flow and patency. Normal compressibility, respiratory phasicity and response to augmentation. Calf Veins: Residual largely occlusive thrombus identified in the visualized left peroneal vein. There is some flow present in 1 segment the peroneal vein. The left posterior tibial veins are normally patent. Superficial Great Saphenous Vein: No evidence of thrombus. Normal compressibility. Venous Reflux:  None. Other Findings: No evidence of superficial thrombophlebitis or abnormal fluid collection. IMPRESSION: 1. Residual mostly occlusive thrombus in the left peroneal vein. No other left-sided acute DVT. 2. No evidence of acute deep vein thrombosis in the right lower extremity. 3. Evidence of probable prior DVT in the right common femoral vein and right profunda femoral vein with some chronic mural thickening present. There  is good flow and patency of these segments currently. 4. Mild mural thickening of the mid to distal left popliteal vein potentially on the basis of prior DVT. Electronically Signed   By: Irish Lack M.D.   On: 04/24/2023 09:50   DG Chest 2 View  Result Date: 04/24/2023 CLINICAL DATA:  67 year old male with history of chest pain. EXAM: CHEST - 2 VIEW COMPARISON:  Chest x-ray 04/13/2023. FINDINGS: New bibasilar opacities may reflect areas of atelectasis and/or consolidation. Small bilateral pleural effusions (left-greater-than-right), new compared to the prior study. No pneumothorax. No evidence of pulmonary edema. Heart size is upper limits of normal. Upper mediastinal contours are within normal limits. Atherosclerotic calcifications are noted in the thoracic aorta. IMPRESSION: 1. Worsening aeration in the lung bases with new bibasilar areas of atelectasis and/or consolidation and small bilateral pleural effusions. 2. Aortic atherosclerosis. Electronically Signed   By: Trudie Reed M.D.   On: 04/24/2023 08:45   IR IVC FILTER PLMT / S&I Lenise Arena GUID/MOD  SED  Result Date: 04/16/2023 INDICATION: LEFT lower extremity DVT. Unable to tolerate anticoagulation secondary to GI bleed. Renal dysfunction, not for contrast administration. EXAM: Procedures: 1. INFERIOR VENA CAVA FILTER PLACEMENT 2. INTRAVASCULAR ULTRASOUND (IVUS) MEDICATIONS: 10 mL lidocaine 1% ANESTHESIA/SEDATION: Local anesthetic was administered. The patient was continuously monitored during the procedure by the interventional radiology nurse under my direct supervision. CONTRAST:  None FLUOROSCOPY TIME:  Fluoroscopic dose; 5 mGy COMPLICATIONS: None immediate. PROCEDURE: Informed written consent was obtained from the the patient and/or patient's representative following explanation of the procedure, risks, benefits and alternatives. A time out was performed prior to the initiation of the procedure. Maximal barrier sterile technique utilized including caps, mask, sterile gowns, sterile gloves, large sterile drape, hand hygiene, and sterile prep. Under sterile condition and local anesthesia, RIGHT common femoral venous access was performed with ultrasound. An ultrasound image was saved and sent to PACS. Over a guidewire, an 0.035 inch intravascular ultrasound (IVUS) catheter was inserted and used to map out the patient's renal veins. Was noted and marked, the IVC filter delivery sheath and inner dilator were advanced into the IVC. Through the delivery sheath, a retrievable Denali IVC filter was deployed below the level of the renal veins and above the IVC bifurcation. Limited post deployment venacavagram was performed. The delivery sheath was removed and hemostasis was obtained with manual compression. A dressing was placed. The patient tolerated the procedure well without immediate post procedural complication. FINDINGS: Intravascular ultrasound demonstrating a patent inferior vena cava. No evidence of thrombus, stenosis, or occlusion. Successful placement of the IVC filter below the level of the renal  veins. IMPRESSION: Successful placement of a retrievable, retrievable inferior vena cava (IVC) filter, using intravascular ultrasound (IVUS) and fluoroscopy. PLAN: IVC filters can cause complications when left in place for extended periods of time. If medically appropriate, recommend discontinuing filter prior to discharge. Please re-evaluate the patient for filter discontinuation when they are seen in follow up, and refer patient to Interventional Radiology for removal. Roanna Banning, MD [facility] Radiology Electronically Signed   By: Roanna Banning M.D.   On: 04/16/2023 17:49   IR INTRAVASCULAR ULTRASOUND NON CORONARY  Result Date: 04/16/2023 INDICATION: LEFT lower extremity DVT. Unable to tolerate anticoagulation secondary to GI bleed. Renal dysfunction, not for contrast administration. EXAM: Procedures: 1. INFERIOR VENA CAVA FILTER PLACEMENT 2. INTRAVASCULAR ULTRASOUND (IVUS) MEDICATIONS: 10 mL lidocaine 1% ANESTHESIA/SEDATION: Local anesthetic was administered. The patient was continuously monitored during the procedure by the  interventional radiology nurse under my direct supervision. CONTRAST:  None FLUOROSCOPY TIME:  Fluoroscopic dose; 5 mGy COMPLICATIONS: None immediate. PROCEDURE: Informed written consent was obtained from the the patient and/or patient's representative following explanation of the procedure, risks, benefits and alternatives. A time out was performed prior to the initiation of the procedure. Maximal barrier sterile technique utilized including caps, mask, sterile gowns, sterile gloves, large sterile drape, hand hygiene, and sterile prep. Under sterile condition and local anesthesia, RIGHT common femoral venous access was performed with ultrasound. An ultrasound image was saved and sent to PACS. Over a guidewire, an 0.035 inch intravascular ultrasound (IVUS) catheter was inserted and used to map out the patient's renal veins. Was noted and  marked, the IVC filter delivery sheath and inner dilator were advanced into the IVC. Through the delivery sheath, a retrievable Denali IVC filter was deployed below the level of the renal veins and above the IVC bifurcation. Limited post deployment venacavagram was performed. The delivery sheath was removed and hemostasis was obtained with manual compression. A dressing was placed. The patient tolerated the procedure well without immediate post procedural complication. FINDINGS: Intravascular ultrasound demonstrating a patent inferior vena cava. No evidence of thrombus, stenosis, or occlusion. Successful placement of the IVC filter below the level of the renal veins. IMPRESSION: Successful placement of a retrievable, retrievable inferior vena cava (IVC) filter, using intravascular ultrasound (IVUS) and fluoroscopy. PLAN: IVC filters can cause complications when left in place for extended periods of time. If medically appropriate, recommend discontinuing filter prior to discharge. Please re-evaluate the patient for filter discontinuation when they are seen in follow up, and refer patient to Interventional Radiology for removal. Roanna Banning, MD [facility] Electronically Signed   By: Roanna Banning M.D.   On: 04/16/2023 17:49   ECHOCARDIOGRAM COMPLETE  Result Date: 04/14/2023    ECHOCARDIOGRAM REPORT   Patient Name:   Timothy Chambers Date of Exam: 04/14/2023 Medical Rec #:  [id number]       Height:       65.0 in Accession #:    [id number]      Weight:       130.0 lb Date of Birth:  1956-05-25       BSA:          1.647 m Patient Age:    67 years        BP:           160/78 mmHg Patient Gender: M               HR:           90 bpm. Exam Location:  Inpatient Procedure: 2D Echo, Cardiac Doppler and Color Doppler Indications:    CHF  History:        Patient has no prior history of Echocardiogram examinations.                 Risk Factors:Hypertension.  Sonographer:     Darlys Gales Referring Phys: [id number] PING T ZHANG IMPRESSIONS  1. Left ventricular ejection fraction, by estimation, is 60 to 65%. The left ventricle has normal function. The left ventricle has no regional wall motion abnormalities. There is moderate left ventricular hypertrophy. Left ventricular diastolic parameters were normal.  2. Right ventricular systolic function is normal. The right ventricular size is normal. There is normal pulmonary artery systolic pressure. The estimated right ventricular systolic pressure is 30.5 mmHg.  3. The mitral valve is normal in structure.  Mild mitral valve regurgitation.  4. The aortic valve is tricuspid. Aortic valve regurgitation is trivial. Aortic valve sclerosis is present, with no evidence of aortic valve stenosis.  5. The inferior vena cava is dilated in size with >50% respiratory variability, suggesting right atrial pressure of 8 mmHg. FINDINGS  Left Ventricle: Left ventricular ejection fraction, by estimation, is 60 to 65%. The left ventricle has normal function. The left ventricle has no regional wall motion abnormalities. The left ventricular internal cavity size was normal in size. There is  moderate left ventricular hypertrophy. Left ventricular diastolic parameters were normal. Right Ventricle: The right ventricular size is normal. No increase in right ventricular wall thickness. Right ventricular systolic function is normal. There is normal pulmonary artery systolic pressure. The tricuspid regurgitant velocity is 2.37 m/s, and  with an assumed right atrial pressure of 8 mmHg, the estimated right ventricular systolic pressure is 30.5 mmHg. Left Atrium: Left atrial size was normal in size. Right Atrium: Right atrial size was normal in size. Pericardium: There is no evidence of pericardial effusion. Mitral Valve: The mitral valve is normal in structure. Mild mitral valve regurgitation. Tricuspid Valve: The tricuspid valve is normal in structure. Tricuspid valve  regurgitation is trivial. Aortic Valve: The aortic valve is tricuspid. Aortic valve regurgitation is trivial. Aortic valve sclerosis is present, with no evidence of aortic valve stenosis. Aortic valve mean gradient measures 4.0 mmHg. Aortic valve peak gradient measures 7.0 mmHg. Aortic valve area, by VTI measures 2.52 cm. Pulmonic Valve: The pulmonic valve was not well visualized. Pulmonic valve regurgitation is trivial. Aorta: The aortic root and ascending aorta are structurally normal, with no evidence of dilitation. Venous: The inferior vena cava is dilated in size with greater than 50% respiratory variability, suggesting right atrial pressure of 8 mmHg. IAS/Shunts: The interatrial septum was not well visualized.  LEFT VENTRICLE PLAX 2D LVIDd:         4.30 cm   Diastology LVIDs:         3.20 cm   LV e' medial:    0.11 cm/s LV PW:         1.30 cm   LV E/e' medial:  10.6 LV IVS:        1.30 cm   LV e' lateral:   0.09 cm/s LVOT diam:     1.90 cm   LV E/e' lateral: 12.9 LV SV:         61 LV SV Index:   37 LVOT Area:     2.84 cm  RIGHT VENTRICLE             IVC RV S prime:     11.90 cm/s  IVC diam: 1.80 cm TAPSE (M-mode): 2.4 cm LEFT ATRIUM             Index        RIGHT ATRIUM           Index LA Vol (A2C):   59.9 ml 36.36 ml/m  RA Area:     10.50 cm LA Vol (A4C):   46.5 ml 28.23 ml/m  RA Volume:   20.60 ml  12.51 ml/m LA Biplane Vol: 52.8 ml 32.05 ml/m  AORTIC VALVE AV Area (Vmax):    2.62 cm AV Area (Vmean):   2.69 cm AV Area (VTI):     2.52 cm AV Vmax:           132.00 cm/s AV Vmean:          89.100 cm/s  AV VTI:            0.243 m AV Peak Grad:      7.0 mmHg AV Mean Grad:      4.0 mmHg LVOT Vmax:         122.00 cm/s LVOT Vmean:        84.400 cm/s LVOT VTI:          0.216 m LVOT/AV VTI ratio: 0.89  AORTA Ao Root diam: 3.50 cm Ao Asc diam:  3.20 cm MITRAL VALVE                  TRICUSPID VALVE MV Area (PHT): 3.72 cm       TR Peak grad:   22.5 mmHg MV Decel Time: 204 msec       TR Vmax:        237.00 cm/s  MR Peak grad:    131.3 mmHg MR Mean grad:    97.0 mmHg    SHUNTS MR Vmax:         573.00 cm/s  Systemic VTI:  0.22 m MR Vmean:        469.0 cm/s   Systemic Diam: 1.90 cm MR PISA:         4.02 cm MR PISA Eff ROA: 27 mm MR PISA Radius:  0.80 cm MV E velocity: 1.12 cm/s MV A velocity: 85.90 cm/s MV E/A ratio:  0.01 Epifanio Lesches MD Electronically signed by Epifanio Lesches MD Signature Date/Time: 04/14/2023/5:18:54 PM    Final    CT ABDOMEN PELVIS WO CONTRAST  Result Date: 04/13/2023 CLINICAL DATA:  Acute renal failure, bilateral lower extremity swelling and pain EXAM: CT ABDOMEN AND PELVIS WITHOUT CONTRAST TECHNIQUE: Multidetector CT imaging of the abdomen and pelvis was performed following the standard protocol without IV contrast. RADIATION DOSE REDUCTION: This exam was performed according to the departmental dose-optimization program which includes automated exposure control, adjustment of the mA and/or kV according to patient size and/or use of iterative reconstruction technique. COMPARISON:  None Available. FINDINGS: Lower chest: No acute abnormality. Hepatobiliary: No solid liver abnormality is seen. Hepatic steatosis. No gallstones, gallbladder wall thickening, or biliary dilatation. Pancreas: Unremarkable. No pancreatic ductal dilatation or surrounding inflammatory changes. Spleen: Normal in size without significant abnormality. Adrenals/Urinary Tract: Adrenal glands are unremarkable. Kidneys are normal, without renal calculi, solid lesion, or hydronephrosis. Bladder is unremarkable. Stomach/Bowel: Stomach is within normal limits. Appendix appears normal. No evidence of bowel wall thickening, distention, or inflammatory changes. Sigmoid diverticulosis. Vascular/Lymphatic: Aortic atherosclerosis. No enlarged abdominal or pelvic lymph nodes. Reproductive: No mass or other significant abnormality. Other: No abdominal wall hernia or abnormality. No ascites. Musculoskeletal: No acute or significant  osseous findings. IMPRESSION: 1. No acute noncontrast CT findings of the abdomen or pelvis to explain acute renal failure. No urinary tract calculi or hydronephrosis. 2. Hepatic steatosis. 3. Sigmoid diverticulosis without evidence of acute diverticulitis. Aortic Atherosclerosis (ICD10-I70.0). Electronically Signed   By: Jearld Lesch M.D.   On: 04/13/2023 09:47   US Venous Img Lower Unilateral Left  Result Date: 04/13/2023 CLINICAL DATA:  Bilateral foot pain Left lower extremity edema EXAM: LEFT LOWER EXTREMITY VENOUS DOPPLER ULTRASOUND TECHNIQUE: Gray-scale sonography with compression, as well as color and duplex ultrasound, were performed to evaluate the deep venous system(s) from the level of the common femoral vein through the popliteal and proximal calf veins. COMPARISON:  None Available. FINDINGS: VENOUS Short segment thrombosis of the peroneal vein in the mid calf which is expanded and noncompressible with  no color flow. Normal compressibility of the common femoral, superficial femoral, and popliteal veins. Visualized portions of profunda femoral vein and great saphenous vein unremarkable. No additional filling defects to suggest DVT on grayscale or color Doppler imaging. Doppler waveforms show otherwise normal direction of venous flow, normal respiratory plasticity and response to augmentation. Limited views of the contralateral common femoral vein are unremarkable. OTHER None. Limitations: none IMPRESSION: Short-segment acute thrombosis of the left peroneal vein in the mid calf. No additional left lower extremity DVT. Electronically Signed   By: Acquanetta Belling M.D.   On: 04/13/2023 09:46   DG Chest Portable 1 View  Result Date: 04/13/2023 CLINICAL DATA:  Three day history of bilateral lower extremity swelling and pain EXAM: PORTABLE CHEST 1 VIEW COMPARISON:  Chest radiograph dated 08/06/2018 FINDINGS: Normal lung volumes. No focal consolidations. No pleural effusion or pneumothorax. The heart size and  mediastinal contours are within normal limits. No acute osseous abnormality. IMPRESSION: No active disease. Electronically Signed   By: Agustin Cree M.D.   On: 04/13/2023 09:44    Final Assessment and Plan: The patient's EKG did not reveal acute ischemic changes.  His CBC was with an anemia to 9.0 but improved from previous measurements.  He has no symptoms of an ongoing GI bleed. Delta  troponin demonstrated elevated but downtrending values from 58-44.  These are downtrending from the patient's previous hospitalization when it was in the 60s.  His BNP was also elevated to 1065 but this is also downtrending from his inpatient hospitalization at 1515.  Likely elevations due to mild hypervolemia in the setting of his ESRD.  He was administered 40 mg of IV Lasix.  His chest pain completely resolved in the emergency department.  Offered admission for observation in the setting of his elevated troponin and chest discomfort, hyperkalemia with potential consideration for IV diuresis however the patient strongly desired to be discharged home.  Will discharge him on a 5-day course of 20 mg Lasix.  Additionally, it had been mentioned that the patient should be started on Eliquis.  Given his recent GI bleed, will defer a loading dose of 10 mg for 7 days and initiate the patient on Eliquis 5 mg twice daily and have him follow-up outpatient.  I confirm this plan with [facility] on call.  He has a scheduled follow-up on Tuesday with his PCP.  The patient is overall well-appearing, in no respiratory distress, no pulmonary edema noted on chest x-ray imaging, not hypoxic, not tachycardic or tachypneic, stable for discharge at this time.   Final Clinical Impression(s) / ED Diagnoses Final diagnoses:  Peripheral edema  Chest pain, unspecified type  Elevated troponin  Prolonged Q-T interval on ECG    Rx / DC Orders ED Discharge Orders          Ordered    furosemide (LASIX) 20 MG tablet  Daily        04/24/23  1039    apixaban (ELIQUIS) 5 MG TABS tablet  2 times daily        04/24/23 1100              Ernie Avena, MD 04/24/23 1101

## 2023-04-24 NOTE — ED Notes (Signed)

## 2023-05-06 ENCOUNTER — Other Ambulatory Visit (HOSPITAL_BASED_OUTPATIENT_CLINIC_OR_DEPARTMENT_OTHER): Payer: Self-pay

## 2023-05-18 DIAGNOSIS — H9193 Unspecified hearing loss, bilateral: Secondary | ICD-10-CM | POA: Diagnosis not present

## 2023-05-18 DIAGNOSIS — G609 Hereditary and idiopathic neuropathy, unspecified: Secondary | ICD-10-CM | POA: Diagnosis not present

## 2023-06-03 DIAGNOSIS — D62 Acute posthemorrhagic anemia: Secondary | ICD-10-CM | POA: Diagnosis not present

## 2023-06-03 DIAGNOSIS — N189 Chronic kidney disease, unspecified: Secondary | ICD-10-CM | POA: Diagnosis not present

## 2023-06-03 DIAGNOSIS — N179 Acute kidney failure, unspecified: Secondary | ICD-10-CM | POA: Diagnosis not present

## 2023-06-03 DIAGNOSIS — N1832 Chronic kidney disease, stage 3b: Secondary | ICD-10-CM | POA: Diagnosis not present

## 2023-06-03 DIAGNOSIS — F1010 Alcohol abuse, uncomplicated: Secondary | ICD-10-CM | POA: Diagnosis not present

## 2023-06-03 DIAGNOSIS — I82402 Acute embolism and thrombosis of unspecified deep veins of left lower extremity: Secondary | ICD-10-CM | POA: Diagnosis not present

## 2023-06-03 DIAGNOSIS — N2581 Secondary hyperparathyroidism of renal origin: Secondary | ICD-10-CM | POA: Diagnosis not present

## 2023-06-03 DIAGNOSIS — I129 Hypertensive chronic kidney disease with stage 1 through stage 4 chronic kidney disease, or unspecified chronic kidney disease: Secondary | ICD-10-CM | POA: Diagnosis not present

## 2023-06-03 DIAGNOSIS — D631 Anemia in chronic kidney disease: Secondary | ICD-10-CM | POA: Diagnosis not present

## 2023-06-18 ENCOUNTER — Other Ambulatory Visit (HOSPITAL_COMMUNITY): Payer: Self-pay | Admitting: *Deleted

## 2023-06-22 ENCOUNTER — Encounter (HOSPITAL_COMMUNITY)
Admission: RE | Admit: 2023-06-22 | Discharge: 2023-06-22 | Disposition: A | Discharge status: Home / Self Care | Payer: Medicare HMO | Source: Ambulatory Visit | Attending: Nephrology | Admitting: Nephrology

## 2023-06-22 DIAGNOSIS — D631 Anemia in chronic kidney disease: Secondary | ICD-10-CM | POA: Diagnosis not present

## 2023-06-22 DIAGNOSIS — N189 Chronic kidney disease, unspecified: Secondary | ICD-10-CM | POA: Diagnosis not present

## 2023-06-22 MED ORDER — SODIUM CHLORIDE 0.9 % IV SOLN
510.0000 mg | INTRAVENOUS | Status: DC
  Administered 2023-06-22: 510 mg via INTRAVENOUS
  Filled 2023-06-22: qty 510

## 2023-06-29 ENCOUNTER — Encounter (HOSPITAL_COMMUNITY): Payer: Medicare HMO

## 2023-07-02 ENCOUNTER — Encounter (HOSPITAL_COMMUNITY)
Admission: RE | Admit: 2023-07-02 | Discharge: 2023-07-02 | Disposition: A | Discharge status: Home / Self Care | Payer: Medicare HMO | Source: Ambulatory Visit | Attending: Nephrology | Admitting: Nephrology

## 2023-07-02 DIAGNOSIS — D631 Anemia in chronic kidney disease: Secondary | ICD-10-CM | POA: Diagnosis not present

## 2023-07-02 DIAGNOSIS — N189 Chronic kidney disease, unspecified: Secondary | ICD-10-CM | POA: Diagnosis not present

## 2023-07-02 MED ORDER — SODIUM CHLORIDE 0.9 % IV SOLN
510.0000 mg | INTRAVENOUS | Status: AC
  Administered 2023-07-02: 510 mg via INTRAVENOUS
  Filled 2023-07-02: qty 510

## 2023-12-28 DIAGNOSIS — Z23 Encounter for immunization: Secondary | ICD-10-CM | POA: Diagnosis not present

## 2023-12-28 DIAGNOSIS — R21 Rash and other nonspecific skin eruption: Secondary | ICD-10-CM | POA: Diagnosis not present

## 2023-12-28 DIAGNOSIS — I10 Essential (primary) hypertension: Secondary | ICD-10-CM | POA: Diagnosis not present

## 2023-12-28 DIAGNOSIS — G609 Hereditary and idiopathic neuropathy, unspecified: Secondary | ICD-10-CM | POA: Diagnosis not present

## 2023-12-28 DIAGNOSIS — Z7689 Persons encountering health services in other specified circumstances: Secondary | ICD-10-CM | POA: Diagnosis not present

## 2023-12-28 DIAGNOSIS — Z131 Encounter for screening for diabetes mellitus: Secondary | ICD-10-CM | POA: Diagnosis not present

## 2024-01-10 ENCOUNTER — Other Ambulatory Visit: Payer: Self-pay

## 2024-01-10 ENCOUNTER — Emergency Department (HOSPITAL_BASED_OUTPATIENT_CLINIC_OR_DEPARTMENT_OTHER)
Admission: EM | Admit: 2024-01-10 | Discharge: 2024-01-10 | Disposition: A | Discharge status: Home / Self Care | Attending: Emergency Medicine | Admitting: Emergency Medicine

## 2024-01-10 ENCOUNTER — Encounter (HOSPITAL_BASED_OUTPATIENT_CLINIC_OR_DEPARTMENT_OTHER): Payer: Self-pay

## 2024-01-10 DIAGNOSIS — R21 Rash and other nonspecific skin eruption: Secondary | ICD-10-CM | POA: Diagnosis not present

## 2024-01-10 DIAGNOSIS — Z79899 Other long term (current) drug therapy: Secondary | ICD-10-CM | POA: Insufficient documentation

## 2024-01-10 DIAGNOSIS — Z7901 Long term (current) use of anticoagulants: Secondary | ICD-10-CM | POA: Insufficient documentation

## 2024-01-10 DIAGNOSIS — I10 Essential (primary) hypertension: Secondary | ICD-10-CM | POA: Diagnosis not present

## 2024-01-10 MED ORDER — PREDNISONE 50 MG PO TABS
50.0000 mg | ORAL_TABLET | Freq: Once | ORAL | Status: AC
  Administered 2024-01-10: 50 mg via ORAL
  Filled 2024-01-10: qty 1

## 2024-01-10 MED ORDER — AMLODIPINE BESYLATE 5 MG PO TABS
10.0000 mg | ORAL_TABLET | Freq: Once | ORAL | Status: AC
  Administered 2024-01-10: 10 mg via ORAL
  Filled 2024-01-10: qty 2

## 2024-01-10 MED ORDER — PREDNISONE 10 MG PO TABS: ORAL_TABLET | ORAL | 0 refills | Status: AC

## 2024-01-10 NOTE — ED Notes (Signed)
 Pt left at home topical medication in room. Pt belonging placed in belonging bag w pt label and given to security

## 2024-01-10 NOTE — ED Triage Notes (Signed)
 Pt reports that he has blisters on bilateral palms/hands and feet. States that his PCP gave cream but the cream isn't working. States that the blisters are painful. No drainage noted. Denies outside [place] travel. Has had an occasional headache.

## 2024-01-10 NOTE — Discharge Instructions (Signed)
 Take the medications as prescribed to see if that helps with the rash.  Follow-up with a dermatologist for further evaluation.  You can also try and contact your primary care doctor for assistance with a referral

## 2024-01-10 NOTE — ED Provider Notes (Signed)
 [place] [facility] Provider Note   CSN: [id number] Arrival date & time: 01/10/24  1324     History  Chief Complaint  Patient presents with   blisters    Timothy Chambers is a 68 y.o. male.  HPI   Patient presents to the ED for evaluation of a rash on his hands and feet.  Patient has a history of hypertension alcohol use disorder DVT who presents to the ED for evaluation of a rash.  Patient states he started having the symptoms around March 15 or 16.  He had [place] in the shed and noticed he started developing a rash on his palms for 2 days.  Patient tried over-the-counter medications without much relief.  He followed up with a primary care doctor on the 18th.  He was given prescription for triamcinolone cream.  Patient states he does not feel like that medication has helped.  He has noticed persistent blisters just on the palms and sole of his feet.  He is not having any rash elsewhere.  He denies any other systemic symptoms of fevers or chills or shortness of breath.  Patient is on blood pressure medications but has not been taking his medications because he finds it hard to use his hands because of the rash  Home Medications Prior to Admission medications   Medication Sig Start Date End Date Taking? Authorizing Provider  predniSONE (DELTASONE) 10 MG tablet Take 5 tablets (50 mg total) by mouth daily with breakfast for 2 days, THEN 4 tablets (40 mg total) daily with breakfast for 2 days, THEN 3 tablets (30 mg total) daily with breakfast for 2 days, THEN 2 tablets (20 mg total) daily with breakfast for 2 days, THEN 1 tablet (10 mg total) daily with breakfast for 2 days. 01/10/24 01/20/24 Yes Linwood Dibbles, MD  amLODipine (NORVASC) 10 MG tablet Take 1 tablet (10 mg total) by mouth daily. 04/17/23   Ghimire, Werner Lean, MD  apixaban (ELIQUIS) 5 MG TABS tablet Take 1 tablet (5 mg total) by mouth 2 (two) times daily. 04/24/23 05/24/23  Ernie Avena, MD  carvedilol  (COREG) 6.25 MG tablet Take 1 tablet (6.25 mg total) by mouth 2 (two) times daily with a meal. 04/17/23   Ghimire, Werner Lean, MD  fluconazole (DIFLUCAN) 100 MG tablet Take 2 tablet (200 mg) by mouth on day 1, then decrease to 1 tablet (100 mg) by mouth once daily on days 2-14 04/23/23   Mansouraty, Netty Starring., MD  furosemide (LASIX) 20 MG tablet Take 1 tablet (20 mg total) by mouth daily for 5 days. 04/24/23 04/29/23  Ernie Avena, MD  pantoprazole (PROTONIX) 40 MG tablet Take 1 tablet (40 mg total) by mouth 2 (two) times daily. 04/17/23   Ghimire, Werner Lean, MD  polycarbophil (FIBERCON) 625 MG tablet Take 1 tablet (625 mg total) by mouth daily. 04/17/23 04/16/24  Ghimire, Werner Lean, MD  sodium bicarbonate 650 MG tablet Take 1 tablet (650 mg total) by mouth 2 (two) times daily. 04/17/23 04/16/24  GhimireWerner Lean, MD      Allergies    Norco [hydrocodone-acetaminophen]    Review of Systems   Review of Systems  Physical Exam Updated Vital Signs BP (!) 194/95   Pulse 92   Temp 97.6 F (36.4 C)   Resp 18   Ht 1.651 m (5\' 5" )   Wt 49 kg   SpO2 100%   BMI 17.97 kg/m  Physical Exam Vitals and nursing note reviewed.  Constitutional:      General: He is not in acute distress.    Appearance: He is well-developed.  HENT:     Head: Normocephalic and atraumatic.     Right Ear: External ear normal.     Left Ear: External ear normal.  Eyes:     General: No scleral icterus.       Right eye: No discharge.        Left eye: No discharge.     Conjunctiva/sclera: Conjunctivae normal.  Neck:     Trachea: No tracheal deviation.  Cardiovascular:     Rate and Rhythm: Normal rate.  Pulmonary:     Effort: Pulmonary effort is normal. No respiratory distress.     Breath sounds: No stridor.  Abdominal:     General: There is no distension.  Musculoskeletal:        General: No swelling or deformity.     Cervical back: Neck supple.  Skin:    General: Skin is warm and dry.     Findings: Rash present.      Comments: Large blister lesions noted on the palms and soles, no rash elsewhere, no mucocutaneous involvement, no urticaria  Neurological:     Mental Status: He is alert. Mental status is at baseline.     Cranial Nerves: No dysarthria or facial asymmetry.     Motor: No seizure activity.     ED Results / Procedures / Treatments   Labs (all labs ordered are listed, but only abnormal results are displayed) Labs Reviewed - No data to display  EKG None  Radiology No results found.  Procedures Procedures    Medications Ordered in ED Medications  amLODipine (NORVASC) tablet 10 mg (10 mg Oral Given 01/10/24 0858)  predniSONE (DELTASONE) tablet 50 mg (50 mg Oral Given 01/10/24 0932)    ED Course/ Medical Decision Making/ A&P Clinical Course as of 01/10/24 1006  Mon Jan 10, 2024  0850 Reviewed case with ID, Dr Renold Don.  Low suspicion for monkey pox. [JK]    Clinical Course User Index [JK] Linwood Dibbles, MD                                 Medical Decision Making Risk Prescription drug management.   Patient presents ED with complaints of a rash on his hands and feet.  He is not having any systemic symptoms.  Consider the possibility of infectious etiology such as monkeypox but after discussing with infectious disease this is rather unlikely.  Considered the possibility of something like pemphigus versus dyshidrotic eczema.  I think he would benefit from follow-up with dermatology.  Will have him start a course of steroids.  Patient did not take his blood pressure medications today.  He was given a dose of his oral medications with some improvement.        Final Clinical Impression(s) / ED Diagnoses Final diagnoses:  Rash  Hypertension, unspecified type    Rx / DC Orders ED Discharge Orders          Ordered    predniSONE (DELTASONE) 10 MG tablet  Q breakfast        01/10/24 1000              Linwood Dibbles, MD 01/10/24 1010

## 2024-02-21 DIAGNOSIS — L120 Bullous pemphigoid: Secondary | ICD-10-CM | POA: Diagnosis not present

## 2024-04-11 DIAGNOSIS — F1010 Alcohol abuse, uncomplicated: Secondary | ICD-10-CM | POA: Diagnosis not present

## 2024-04-11 DIAGNOSIS — N179 Acute kidney failure, unspecified: Secondary | ICD-10-CM | POA: Diagnosis not present

## 2024-04-11 DIAGNOSIS — D631 Anemia in chronic kidney disease: Secondary | ICD-10-CM | POA: Diagnosis not present

## 2024-04-11 DIAGNOSIS — D62 Acute posthemorrhagic anemia: Secondary | ICD-10-CM | POA: Diagnosis not present

## 2024-04-11 DIAGNOSIS — I129 Hypertensive chronic kidney disease with stage 1 through stage 4 chronic kidney disease, or unspecified chronic kidney disease: Secondary | ICD-10-CM | POA: Diagnosis not present

## 2024-04-11 DIAGNOSIS — I82402 Acute embolism and thrombosis of unspecified deep veins of left lower extremity: Secondary | ICD-10-CM | POA: Diagnosis not present

## 2024-04-11 DIAGNOSIS — N189 Chronic kidney disease, unspecified: Secondary | ICD-10-CM | POA: Diagnosis not present

## 2024-04-11 DIAGNOSIS — N184 Chronic kidney disease, stage 4 (severe): Secondary | ICD-10-CM | POA: Diagnosis not present

## 2024-04-11 DIAGNOSIS — N2581 Secondary hyperparathyroidism of renal origin: Secondary | ICD-10-CM | POA: Diagnosis not present

## 2024-04-17 ENCOUNTER — Telehealth (HOSPITAL_COMMUNITY): Payer: Self-pay | Admitting: Pharmacy Technician

## 2024-04-17 NOTE — Telephone Encounter (Addendum)
 Auth Submission: NO AUTH NEEDED Site of care: Site of care: [facility] Payer: [employer] Medicare Medication & CPT/J Code(s) submitted: Venofer (Iron Sucrose) [id number] Diagnosis Code: D63.1 Route of submission (phone, fax, portal): portal Phone # Fax # Auth type: Buy/Bill HB Units/visits requested: 200mg  x 2 doses Reference number:  Approval from: 04/17/24 to 10/11/24    Dagoberto Armour, CPhT [facility] [phone]

## 2024-04-18 DIAGNOSIS — L120 Bullous pemphigoid: Secondary | ICD-10-CM | POA: Diagnosis not present

## 2024-04-18 DIAGNOSIS — L209 Atopic dermatitis, unspecified: Secondary | ICD-10-CM | POA: Diagnosis not present

## 2024-04-18 DIAGNOSIS — L301 Dyshidrosis [pompholyx]: Secondary | ICD-10-CM | POA: Diagnosis not present

## 2024-04-18 NOTE — Progress Notes (Signed)
 Chief Complaint  Patient presents with  . New Patient    Rash hands, feet and elbows. Possible BP?    SUBJECTIVE:  Timothy Chambers is a 68 y.o. male who presents to dermatology clinic today as a new patient with the following skin concern(s):   1) Lesions on Bilateral Hands and Feet Onset: Started at the end of March 2025 Duration: Has been waxing and waning since March, improves with prednisone   Symptoms: Painful, pruritic Previous treatments and response: Prednisone  (improved - has been taking it on and off, only thing that has helped), triamcinolone (did not help), mupirocin (did not help)  Pertinent medical, family, and social history reviewed.  OBJECTIVE: Ht 1.651 m (5' 5)   Wt 48.4 kg (106 lb 9.6 oz)   BMI 17.74 kg/m   Limited skin examination performed. Exam performed of the: Hands, arms, legs, feet, mouth  Significant exam findings include: - On bilateral palms, soles of feet, and arms patient with scattered clear vesicles coalescing into bullae as well as post-inflammatory hyperpigmented macules from prior lesions. Some bullae appeared to have ruptured with overlying crust - Generalized xerosis - Areas of lichenification on bilateral lower extremities   ASSESSMENT:  1. Dyshidrotic eczema  Comprehensive Metabolic Panel   CBC with Differential   mycophenolate (CELLCEPT) 250 mg capsule   clobetasoL (TEMOVATE) 0.05 % cream    2. Atopic dermatitis, unspecified type         PLAN: - Diagnosis and treatment options discussed. All questions and concerns addressed. - Obtain baseline CBC and CMP prior to starting Cellcept, recheck at next visit in 6 weeks - Prescription recommendations: START Mycophenolate 250mg  in the morning with breakfast, 500mg  in the evening with dinner CONTINUE Prednisone  10mg  daily until Mycophenolate kicks in CONTINUE Clobetasol BID only to affected areas  - Key side effects of above medications discussed with patient. - RTC Return in about 6 weeks  (around 05/30/2024) for Recheck.  Electronically signed by:  Rumalda Jenkins Ahle, MD 04/18/2024 1:05 PM

## 2024-04-19 ENCOUNTER — Telehealth (HOSPITAL_COMMUNITY): Payer: Self-pay

## 2024-04-19 DIAGNOSIS — D631 Anemia in chronic kidney disease: Secondary | ICD-10-CM | POA: Diagnosis not present

## 2024-04-19 DIAGNOSIS — I129 Hypertensive chronic kidney disease with stage 1 through stage 4 chronic kidney disease, or unspecified chronic kidney disease: Secondary | ICD-10-CM | POA: Diagnosis not present

## 2024-04-19 DIAGNOSIS — I10 Essential (primary) hypertension: Secondary | ICD-10-CM | POA: Diagnosis not present

## 2024-04-19 DIAGNOSIS — N179 Acute kidney failure, unspecified: Secondary | ICD-10-CM | POA: Diagnosis not present

## 2024-04-19 DIAGNOSIS — R0602 Shortness of breath: Secondary | ICD-10-CM | POA: Diagnosis not present

## 2024-04-19 DIAGNOSIS — L301 Dyshidrosis [pompholyx]: Secondary | ICD-10-CM | POA: Diagnosis not present

## 2024-04-19 DIAGNOSIS — D649 Anemia, unspecified: Secondary | ICD-10-CM | POA: Diagnosis not present

## 2024-04-19 DIAGNOSIS — G4489 Other headache syndrome: Secondary | ICD-10-CM | POA: Diagnosis not present

## 2024-04-19 DIAGNOSIS — N185 Chronic kidney disease, stage 5: Secondary | ICD-10-CM | POA: Diagnosis not present

## 2024-04-19 DIAGNOSIS — I120 Hypertensive chronic kidney disease with stage 5 chronic kidney disease or end stage renal disease: Secondary | ICD-10-CM | POA: Diagnosis not present

## 2024-04-19 DIAGNOSIS — E872 Acidosis, unspecified: Secondary | ICD-10-CM | POA: Diagnosis not present

## 2024-04-19 DIAGNOSIS — E8729 Other acidosis: Secondary | ICD-10-CM | POA: Diagnosis not present

## 2024-04-19 DIAGNOSIS — Z91148 Patient's other noncompliance with medication regimen for other reason: Secondary | ICD-10-CM | POA: Diagnosis not present

## 2024-04-19 DIAGNOSIS — R7889 Finding of other specified substances, not normally found in blood: Secondary | ICD-10-CM | POA: Diagnosis not present

## 2024-04-19 DIAGNOSIS — I700 Atherosclerosis of aorta: Secondary | ICD-10-CM | POA: Diagnosis not present

## 2024-04-19 DIAGNOSIS — Z95828 Presence of other vascular implants and grafts: Secondary | ICD-10-CM | POA: Diagnosis not present

## 2024-04-19 DIAGNOSIS — R42 Dizziness and giddiness: Secondary | ICD-10-CM | POA: Diagnosis not present

## 2024-04-19 DIAGNOSIS — N289 Disorder of kidney and ureter, unspecified: Secondary | ICD-10-CM | POA: Diagnosis not present

## 2024-04-19 DIAGNOSIS — N189 Chronic kidney disease, unspecified: Secondary | ICD-10-CM | POA: Diagnosis not present

## 2024-04-19 DIAGNOSIS — E876 Hypokalemia: Secondary | ICD-10-CM | POA: Diagnosis not present

## 2024-04-19 DIAGNOSIS — R531 Weakness: Secondary | ICD-10-CM | POA: Diagnosis not present

## 2024-04-19 NOTE — Telephone Encounter (Addendum)
 Auth Submission: PENDING Site of care: Site of care: [facility] Payer: [employer] Medication & CPT/J Code(s) submitted: Retacrit [id number] Diagnosis Code: N18.4, D63.1 Route of submission (phone, fax, portal): portal Phone # Fax # Auth type: Buy/Bill HB Units/visits requested: 30000 units q4weeks Reference number: [id number] Approval from: 04/19/24 to 10/11/24   Will update once decision is made.

## 2024-04-19 NOTE — Telephone Encounter (Signed)
 Auth Submission: APPROVED Site of care: Site of care: [facility] Payer: [employer] Medication & CPT/J Code(s) submitted: Retacrit [id number] Diagnosis Code: N18.4, D63.1 Route of submission (phone, fax, portal): portal Phone # Fax # Auth type: Buy/Bill HB Units/visits requested: 30000 units q4weeks Reference number: [id number] Approval from: 04/18/24 to 10/11/24

## 2024-04-19 NOTE — Telephone Encounter (Signed)
-----   Message from Joseph Jorizzo, MD sent at 04/18/2024  5:51 PM EDT ----- Only take 250 mg Cellcept ONE PER DAY - go to urgent care or primary MD ASAP for SEVERE anemia and KIDNEY dysfunction  MAY HAVE GI  bleed with outside chronic prednisone  ----- Message ----- From: Lab, Background User Sent: 04/18/2024   4:43 PM EDT To: Joseph Jorizzo, MD

## 2024-04-19 NOTE — Telephone Encounter (Signed)
 Telephone Triage Note:  S: Situation  Full name and date of birth verified.  Patient: Timothy Chambers DOB: 07-03-1956  Date: Wed 04/19/2024  Time: 9:45 AM   B: Background  CBC, CMP  A: Assessment  Nurse explained the lab results to the patient. Nurse answered questions regarding the results, which were addressed adequately. Patient verbalized understanding of the findings and expressed no further questions.  R: Recommendation  Nurse inform patient to take Cellcept 250 mg one per day. He will go to the urgent care or primary care provider regarding severe anemia and kidney dysfunction.  Advise the patient to contact the office if patient has additional questions or concerns.  Victoria K Ziglar, LPN

## 2024-04-20 DIAGNOSIS — D649 Anemia, unspecified: Secondary | ICD-10-CM | POA: Diagnosis not present

## 2024-04-21 DIAGNOSIS — D649 Anemia, unspecified: Secondary | ICD-10-CM | POA: Diagnosis not present

## 2024-04-22 DIAGNOSIS — D649 Anemia, unspecified: Secondary | ICD-10-CM | POA: Diagnosis not present

## 2024-04-22 NOTE — Discharge Summary (Signed)
 [place] [facility]  Discharge Summary   Name: Timothy Chambers Age: 68 yrs  MRN: [id number] DOB: [birth year]  Admit Date: 04/19/2024 Admitting Physician: Lane Lonna Von Jadine, MD  Discharge Date: 04/22/2024 Discharge Physician: Tyronne West Kos, MD    Discharge Diagnoses:   Principal Problem (Resolved):   Acute on chronic renal insufficiency Active Problems:   Acute on chronic anemia   Dyshidrotic eczema   Essential hypertension Resolved Problems:   Metabolic acidosis   Hypomagnesemia   Hypokalemia   TO DO List at Follow-up for PCP/Specialist:   Key Medication changes: Increased carvedilol  all dose.  Oral bicarb and PhosLo started. Pending labs to follow up on: None Incidental findings requiring follow-up: None     Hospital Course:   For full details, please see H&P, progress notes, consult notes and ancillary notes. Briefly, Timothy Chambers is a 68 y.o. year old male with a PMH of hypertension, CKD, peripheral neuropathy, chronic anemia, DVT s/p IVC filter July 2024, H/O GI bleed and prior alcohol use., who was advised to come to the ED due to outpatient labs suggesting worsening renal function and anemia.  On ED workup lab work showed BUN 62, creatinine 6.01, hemoglobin 7.2 with hematocrit 21.5.  The patient's hospital course will be summarized in a problem based approach below.   Patient with history of CKD and now progressing to worsening renal function.  Patient presents with abnormal lab workup and referred from PCPs office to the ER. BUN 62 with creatinine 6.01 on admission (creatinine 1 year ago was 3.26). Patient was acidotic started on bicarb drip.  Renal ultrasound showed evidence of chronic medical renal disease.  Creatinine level improved to 4.48 and also had improvement in bicarb level from 12 to 20. Nephrology consulted (Dr. Carlette), Changed from IV to oral bicarb and also started on oral calcium phosphate. The fluids discontinued, creatinine remains  stable at 4.48, no acute indication felt for hemodialysis.  Nephrology recommended for patient to follow-up with his primary nephrologist Dr. Rayburn who had recognized that patient had stage IV-V CKD.   Metabolic acidosis Due to worsening renal function, started on bicarb supplement as above  Acute on chronic anemia Patient denies melena or hematochezia.  Stool occult test is negative.  Patient has previously of anemia due to blood loss in July 2024 and was hospitalized at [facility] facility.  Did have a unremarkable EGD and colonoscopy at that time.  Did require blood transfusion at that time. Anemia related to chronic kidney disease denies any GI bleeding. He did receive 1 unit PRBC in the ER. Started on PPI Hemoglobin dropped again to 6.7 on 7/11.  1 unit packed red blood cells ordered and hemoglobin is 8.7 this morning.  Outpatient GI follow-up commended.  Hypomagnesemia Received 4 g of IV magnesium  for severe hypomagnesemia and improved to 2.3.  Dyshidrotic eczema Following with dermatology outpatient, recently started on prednisone  and tapering down, recently prescribed CellCept by his dermatologist to transition from prednisone  to Cellcept.  Completed prednisone  taper during hospital stay. Patient states that he had not started taking CellCept yet, was held during hospital stay due to worsening renal function.  Patient has been advised to talk to his nephrologist before initiating CellCept.  Hypokalemia Hypokalemia supplemented with p.o. potassium.    Essential hypertension Blood pressure was elevated on presentation, home antihypertensives educations resumed and carvedilol  dose increased since blood pressure remained poorly controlled.  He has been advised follow-up with PCP for further adjustments.   History of left lower  extremity DVT History of IVC filter in place July 2024 Patient has been off Eliquis  for past 5 months, previously he was on eliquis .   History of medication  noncompliance Importance of compliance with medications to be reinforced.  Leukocytosis Felt to be secondary to steroids and not significantly changed.  Patient remains afebrile and asymptomatic.    The patient's chronic medical conditions were treated accordingly per the patient's home medication regimen except as noted in the plan above and in the medication list below.    Discharge Condition:   Disposition: Patient discharged to Home or Self Care in stable condition.  Diet at discharge:    Dietary Orders  (From admission, onward)               Ordered    Nepro; Twice daily with Breakfast/Supper  Once       Question Answer Comment  WF[facility]: Nepro   Frequency: Twice daily with Breakfast/Supper      04/21/24 1056    Adult Diet- Renal; CKD (pro/K/Phos/Na restricted)  Diet effective now       References:    Medical Nutrition Management (MNM) for Registered Dietitian  Question Answer Comment  Diet type: Renal   Renal Restriction: CKD (pro/K/Phos/Na restricted)   Medical Nutrition Management By RD Yes, Medical Nutrition Management By RD      04/19/24 2229            Activity at Discharge: Ambulate ad lib  Wound/Incision Care Instructions: None  Photo of wound/incision: None      Physical Exam at Discharge   BP (!) 166/88 (BP Location: Right arm, Patient Position: Lying)   Pulse 68   Temp 98.6 F (37 C) (Oral)   Resp 16   Ht 1.651 m (5' 5)   Wt 49.4 kg (109 lb)   SpO2 99%   BMI 18.14 kg/m  GEN: NAD, lying in bed EYES: EOMI ENT: MMM CV: RRR, no murmurs appreciated PULM: CTA B ABD: soft, NT/ND, +BS EXT: No edema NEURO: No focal deficits PSYCH: A+Ox3, appropriate GU: No CVA tenderness MSK: No spinal tenderness   Discharge Medications:      Medication List     START taking these medications    calcium acetate 667 mg (169 mg calcium) capsule Commonly known as: PHOSLO Take 1 capsule (667 mg total) by mouth in the morning and 1 capsule (667 mg  total) at noon and 1 capsule (667 mg total) in the evening. Take with meals.   sodium bicarbonate  650 mg tablet Take 2 tablets (1,300 mg total) by mouth 2 (two) times a day.       CHANGE how you take these medications    carvediloL  25 mg tablet Commonly known as: COREG  Take 1 tablet (25 mg total) by mouth in the morning and 1 tablet (25 mg total) in the evening. Take with meals. What changed:  medication strength how much to take       CONTINUE taking these medications    amLODIPine  10 mg tablet Commonly known as: NORVASC  Take 1 tablet (10 mg total) by mouth daily.   clobetasoL 0.05 % cream Commonly known as: TEMOVATE Apply twice a day to only the scaly areas.   varicella-zoster (PF) 50 mcg/0.5 mL Susr vaccine Commonly known as: SHINGRIX Administer one dose. Then administer second dose in 2-6 months       STOP taking these medications    DULoxetine 30 mg capsule Commonly known as: CYMBALTA   mycophenolate 250 mg  capsule Commonly known as: CELLCEPT   predniSONE  10 mg tablet Commonly known as: DELTASONE          Where to Get Your Medications     These medications were sent to [facility] - [place], [place] - [address] - PHONE: [phone] - FAX: [phone]  [address], [place] [address]    Phone: [phone]  calcium acetate 667 mg (169 mg calcium) capsule carvediloL  25 mg tablet sodium bicarbonate  650 mg tablet     Significant Diagnostic Tests:   LABS:  Lab Results  Component Value Date   WBC 13.62 (H) 04/22/2024   HGB 8.7 (L) 04/22/2024   HCT 25.1 (L) 04/22/2024   PLT 276 04/22/2024   ALT 11 04/21/2024   AST 13 04/21/2024   NA 143 04/22/2024   K 3.4 04/22/2024   CL 113 (H) 04/22/2024   CREATININE 4.48 (H) 04/22/2024   BUN 64 (H) 04/22/2024   CO2 20 (L) 04/22/2024   HGBA1C 4.7 12/28/2023   IMAGING:  US  Renal Bilateral Complete  Final Result by Delmar Herbert Winfred Booker Results In [place] [id number] (07/09 2102)  CLINICAL  DATA:  Acute renal failure.  Hypertension.    EXAM:  RENAL / URINARY TRACT ULTRASOUND COMPLETE    COMPARISON:  CT abdomen and pelvis 04/13/2023    FINDINGS:  Right Kidney:    Renal measurements: 8.3 x 3.7 x 4.4 cm = volume: 73 mL. Diffusely  increased parenchymal echotexture suggesting chronic medical renal  disease. No hydronephrosis. No focal lesions are identified.    Left Kidney:    Renal measurements: 8 x 4.4 x 3.6 cm = volume: 68 mL. Diffusely  increased parenchymal echotexture suggesting chronic medical renal  disease. No hydronephrosis. No focal lesions are identified.    Bladder:    Normal appearance of the bladder. No significant wall thickening or  intraluminal filling defects.    Other:    Enlarged prostate gland measuring 3.8 x 4.1 x 4.6 cm    IMPRESSION:  1. Increased parenchymal echotexture to the kidneys bilaterally  consistent with chronic medical renal disease. No hydronephrosis.  2. Normal appearance of the bladder.  3. Enlarged prostate gland.      Electronically Signed    By: Elsie Gravely M.D.    On: 04/19/2024 21:02      XR Chest 1 View  Final Result by Delmar Herbert Winfred Booker Results In [place] [id number] (07/09 2037)  CLINICAL DATA:  Shortness of breath.    EXAM:  CHEST  1 VIEW    COMPARISON:  04/24/2023.    FINDINGS:  The heart size and mediastinal contours are within normal limits.  There is atherosclerotic calcification of the aorta. No  consolidation, effusion, or pneumothorax is seen. No acute osseous  abnormality.    IMPRESSION:  No active disease.      Electronically Signed    By: Leita Birmingham M.D.    On: 04/19/2024 20:37       CULTURES:  No results found for this visit on 04/19/24 (from the past week). PATHOLOGY: None   Surgeries/Procedures:     Other procedures performed: None  Consults:   IP CONSULT TO HOSPITALIST IP CONSULT TO NEPHROLOGY   Follow-up Appointments:     Future Appointments  Date Time Provider  Department Center  05/30/2024  2:30 PM Joseph Jorizzo, MD [facility]      Predictive Model Details       16% (Medium) Factor Value  Risk of Unplanned Readmission Model Number of active inpatient medication orders 18    Latest calcium low (6.7 mg/dL)    Latest BUN high (64 mg/dL)    Diagnosis of electrolyte disorder present    Imaging order present in last 6 months    Latest hemoglobin low (8.7 g/dL)    Phosphorous result present    Number of ED visits in last six months 1    Age 89    Diagnosis of deficiency anemia present    Active corticosteroid inpatient medication order present    Latest creatinine high (4.48 mg/dL)    Diagnosis of renal failure present    Current length of stay 2.572 days    Future appointment scheduled    Active ulcer inpatient medication order present      Electronically signed by: Tyronne West Kos, MD 04/22/2024 1:21 PM Time spent on discharge: 34 minutes.

## 2024-04-25 ENCOUNTER — Other Ambulatory Visit (HOSPITAL_COMMUNITY): Payer: Self-pay

## 2024-04-26 ENCOUNTER — Inpatient Hospital Stay (HOSPITAL_COMMUNITY): Admission: RE | Admit: 2024-04-26 | Source: Ambulatory Visit

## 2024-04-28 ENCOUNTER — Encounter (HOSPITAL_COMMUNITY)
Admission: RE | Admit: 2024-04-28 | Discharge: 2024-04-28 | Disposition: A | Discharge status: Home / Self Care | Source: Ambulatory Visit | Attending: Nephrology | Admitting: Nephrology

## 2024-04-28 VITALS — BP 138/68 | HR 87 | Temp 98.4°F | Resp 16

## 2024-04-28 DIAGNOSIS — N179 Acute kidney failure, unspecified: Secondary | ICD-10-CM | POA: Diagnosis not present

## 2024-04-28 DIAGNOSIS — N184 Chronic kidney disease, stage 4 (severe): Secondary | ICD-10-CM | POA: Diagnosis not present

## 2024-04-28 DIAGNOSIS — D631 Anemia in chronic kidney disease: Secondary | ICD-10-CM | POA: Diagnosis not present

## 2024-04-28 DIAGNOSIS — D509 Iron deficiency anemia, unspecified: Secondary | ICD-10-CM | POA: Insufficient documentation

## 2024-04-28 LAB — POCT HEMOGLOBIN-HEMACUE: Hemoglobin: 7.8 g/dL — ABNORMAL LOW (ref 13.0–17.0)

## 2024-04-28 MED ORDER — EPOETIN ALFA-EPBX 40000 UNIT/ML IJ SOLN
INTRAMUSCULAR | Status: AC
  Filled 2024-04-28: qty 1

## 2024-04-28 MED ORDER — EPOETIN ALFA-EPBX 40000 UNIT/ML IJ SOLN
30000.0000 [IU] | INTRAMUSCULAR | Status: DC
  Administered 2024-04-28: 30000 [IU] via SUBCUTANEOUS

## 2024-04-28 MED ORDER — IRON SUCROSE 200 MG IVPB - SIMPLE MED
200.0000 mg | Status: DC
  Administered 2024-04-28: 200 mg via INTRAVENOUS
  Filled 2024-04-28: qty 200

## 2024-05-05 ENCOUNTER — Encounter (HOSPITAL_COMMUNITY)
Admission: RE | Admit: 2024-05-05 | Discharge: 2024-05-05 | Disposition: A | Discharge status: Home / Self Care | Source: Ambulatory Visit | Attending: Nephrology | Admitting: Nephrology

## 2024-05-05 DIAGNOSIS — D509 Iron deficiency anemia, unspecified: Secondary | ICD-10-CM | POA: Diagnosis not present

## 2024-05-05 DIAGNOSIS — N179 Acute kidney failure, unspecified: Secondary | ICD-10-CM | POA: Diagnosis not present

## 2024-05-05 DIAGNOSIS — N184 Chronic kidney disease, stage 4 (severe): Secondary | ICD-10-CM | POA: Diagnosis not present

## 2024-05-05 DIAGNOSIS — D631 Anemia in chronic kidney disease: Secondary | ICD-10-CM | POA: Diagnosis not present

## 2024-05-05 MED ORDER — IRON SUCROSE 200 MG IVPB - SIMPLE MED
200.0000 mg | Status: AC
  Administered 2024-05-05: 200 mg via INTRAVENOUS
  Filled 2024-05-05: qty 200

## 2024-05-10 ENCOUNTER — Encounter (HOSPITAL_COMMUNITY)

## 2024-05-24 ENCOUNTER — Encounter (HOSPITAL_COMMUNITY)

## 2024-05-26 ENCOUNTER — Ambulatory Visit (HOSPITAL_COMMUNITY)
Admission: RE | Admit: 2024-05-26 | Discharge: 2024-05-26 | Disposition: A | Discharge status: Home / Self Care | Source: Ambulatory Visit | Attending: Nephrology | Admitting: Nephrology

## 2024-05-26 VITALS — BP 141/71 | HR 80 | Temp 97.9°F | Resp 16

## 2024-05-26 DIAGNOSIS — D631 Anemia in chronic kidney disease: Secondary | ICD-10-CM | POA: Diagnosis not present

## 2024-05-26 DIAGNOSIS — N179 Acute kidney failure, unspecified: Secondary | ICD-10-CM | POA: Insufficient documentation

## 2024-05-26 LAB — RENAL FUNCTION PANEL
Albumin: 2.9 g/dL — ABNORMAL LOW (ref 3.5–5.0)
Anion gap: 10 (ref 5–15)
BUN: 36 mg/dL — ABNORMAL HIGH (ref 8–23)
CO2: 17 mmol/L — ABNORMAL LOW (ref 22–32)
Calcium: 9 mg/dL (ref 8.9–10.3)
Chloride: 112 mmol/L — ABNORMAL HIGH (ref 98–111)
Creatinine, Ser: 4.77 mg/dL — ABNORMAL HIGH (ref 0.61–1.24)
GFR, Estimated: 13 mL/min — ABNORMAL LOW (ref >60)
Glucose, Bld: 128 mg/dL — ABNORMAL HIGH (ref 70–99)
Phosphorus: 3.7 mg/dL (ref 2.5–4.6)
Potassium: 3.6 mmol/L (ref 3.5–5.1)
Sodium: 139 mmol/L (ref 135–145)

## 2024-05-26 LAB — POCT HEMOGLOBIN-HEMACUE: Hemoglobin: 9 g/dL — ABNORMAL LOW (ref 13.0–17.0)

## 2024-05-26 MED ORDER — EPOETIN ALFA-EPBX 40000 UNIT/ML IJ SOLN
30000.0000 [IU] | INTRAMUSCULAR | Status: DC
  Administered 2024-05-26: 30000 [IU] via SUBCUTANEOUS

## 2024-05-26 MED ORDER — EPOETIN ALFA-EPBX 40000 UNIT/ML IJ SOLN
INTRAMUSCULAR | Status: AC
  Filled 2024-05-26: qty 1

## 2024-05-30 DIAGNOSIS — L301 Dyshidrosis [pompholyx]: Secondary | ICD-10-CM | POA: Diagnosis not present

## 2024-06-16 ENCOUNTER — Telehealth: Payer: Self-pay | Admitting: Pharmacy Technician

## 2024-06-16 NOTE — Telephone Encounter (Addendum)
 Auth Submission: APPROVED Site of care: Site of care: [facility] Payer: [employer] MEDICARE Medication & CPT/J Code(s) submitted: RETACRIT  Q5106 Diagnosis Code: N18.4 Route of submission (phone, fax, portal):  Phone # Fax # Auth type: Buy/Bill HB Units/visits requested: 30,000 UNITS Q4WKS Reference number: [id number] Approval from: 04/19/24 to 10/11/24

## 2024-06-20 DIAGNOSIS — D631 Anemia in chronic kidney disease: Secondary | ICD-10-CM | POA: Insufficient documentation

## 2024-06-23 ENCOUNTER — Inpatient Hospital Stay (HOSPITAL_COMMUNITY)
Admission: RE | Admit: 2024-06-23 | Discharge: 2024-06-23 | Discharge status: Home / Self Care | Source: Ambulatory Visit | Attending: Nephrology

## 2024-06-23 VITALS — BP 153/64 | HR 63 | Temp 96.7°F | Resp 17

## 2024-06-23 DIAGNOSIS — D631 Anemia in chronic kidney disease: Secondary | ICD-10-CM | POA: Insufficient documentation

## 2024-06-23 DIAGNOSIS — N184 Chronic kidney disease, stage 4 (severe): Secondary | ICD-10-CM | POA: Insufficient documentation

## 2024-06-23 LAB — RENAL FUNCTION PANEL
Albumin: 3.1 g/dL — ABNORMAL LOW (ref 3.5–5.0)
Anion gap: 14 (ref 5–15)
BUN: 86 mg/dL — ABNORMAL HIGH (ref 8–23)
CO2: 11 mmol/L — ABNORMAL LOW (ref 22–32)
Calcium: 7.9 mg/dL — ABNORMAL LOW (ref 8.9–10.3)
Chloride: 112 mmol/L — ABNORMAL HIGH (ref 98–111)
Creatinine, Ser: 4.94 mg/dL — ABNORMAL HIGH (ref 0.61–1.24)
GFR, Estimated: 12 mL/min — ABNORMAL LOW (ref >60)
Glucose, Bld: 131 mg/dL — ABNORMAL HIGH (ref 70–99)
Phosphorus: 5.7 mg/dL — ABNORMAL HIGH (ref 2.5–4.6)
Potassium: 3.6 mmol/L (ref 3.5–5.1)
Sodium: 137 mmol/L (ref 135–145)

## 2024-06-23 LAB — IRON AND TIBC
Iron: 69 ug/dL (ref 45–182)
Saturation Ratios: 28 % (ref 17.9–39.5)
TIBC: 251 ug/dL (ref 250–450)
UIBC: 182 ug/dL

## 2024-06-23 LAB — FERRITIN: Ferritin: 285 ng/mL (ref 24–336)

## 2024-06-23 LAB — POCT HEMOGLOBIN-HEMACUE: Hemoglobin: 10 g/dL — ABNORMAL LOW (ref 13.0–17.0)

## 2024-06-23 MED ORDER — EPOETIN ALFA-EPBX 40000 UNIT/ML IJ SOLN: 30000.0000 [IU] | Freq: Once | INTRAMUSCULAR | Status: DC

## 2024-06-23 MED ORDER — EPOETIN ALFA-EPBX 20000 UNIT/ML IJ SOLN
INTRAMUSCULAR | Status: AC
  Filled 2024-06-23: qty 1

## 2024-06-23 MED ORDER — EPOETIN ALFA-EPBX 10000 UNIT/ML IJ SOLN
INTRAMUSCULAR | Status: AC
  Filled 2024-06-23: qty 1

## 2024-06-23 MED ORDER — EPOETIN ALFA-EPBX 10000 UNIT/ML IJ SOLN
10000.0000 [IU] | Freq: Once | INTRAMUSCULAR | Status: AC
  Administered 2024-06-23: 10000 [IU] via SUBCUTANEOUS

## 2024-06-23 MED ORDER — EPOETIN ALFA-EPBX 20000 UNIT/ML IJ SOLN
20000.0000 [IU] | Freq: Once | INTRAMUSCULAR | Status: AC
  Administered 2024-06-23: 20000 [IU] via SUBCUTANEOUS

## 2024-07-18 ENCOUNTER — Other Ambulatory Visit (HOSPITAL_COMMUNITY): Payer: Self-pay | Admitting: Nephrology

## 2024-07-18 NOTE — Care Plan (Signed)
 Problem: Health Behavior: Goal: MCB Ability to state ways to decrease the risk of falls will be met by discharge Description: Ability to state ways to decrease the risk of falls will improve by discharge Outcome: Progressing   Problem: Safety: Goal: Will remain free from falls by discharge Description: Will remain free from falls by discharge Outcome: Progressing   Problem: Knowledge Deficit Goal: Patient/family/caregiver demonstrates understanding of disease process, treatment plan, medications, and discharge instructions Description: Complete learning assessment and assess knowledge base. Outcome: Progressing   Problem: Compromised Skin Integrity Goal: Skin integrity is maintained or improved Description: Assess and monitor skin integrity. Identify patients at risk for skin breakdown on admission and per policy. Collaborate with interdisciplinary team and initiate plans and interventions as needed.    Outcome: Progressing Goal: Fluid and electrolyte balance are achieved/maintained Description: Assess and monitor vital signs (orthostatic vitals if applicable), fluid intake and output, urine color, labs, skin turgor, mucous membranes, jugular venous distention, edema, circumference of edematous extremities and abdominal girth, respiratory status, and mental status.  Monitor for signs and symptoms of hypovolemia (tachycardia, rapid breathing, decreased urine output, postural hypotension, confusion, syncope).  Monitor for signs and symptoms of hypervolemia (strong rapid pulse, shortness of breath, difficulty breathing lying down, crackles heard in lung fields, edema). Collaborate with interdisciplinary team and initiate plan and interventions as ordered. Outcome: Progressing Goal: Nutritional status is improving Description: Monitor and assess patient for malnutrition (ex- brittle hair, bruises, dry skin, pale skin and conjunctiva, muscle wasting, smooth red tongue, and disorientation).  Collaborate with interdisciplinary team and initiate plan and interventions as ordered.  Monitor patient's weight and dietary intake as ordered or per policy. Utilize nutrition screening tool and intervene per policy. Determine patient's food preferences and provide high-protein, high-caloric foods as appropriate.  Outcome: Progressing   Problem: Urinary Incontinence Goal: Perineal skin integrity is maintained or improved Description: Assess genitourinary system, perineal skin, labs (urinalysis), and history of incontinence to include past management, aggravating, and alleviating factors.  Collaborate with interdisciplinary team and initiate plans and interventions as needed. Outcome: Progressing   Problem: PAIN - ADULT Goal: Verbalizes/displays adequate comfort level or baseline comfort level Description: INTERVENTIONS: 1. Encourage pt to monitor pain and request assistance 2. Assess pain using appropriate pain scale 3. Administer analgesics based on type and severity of pain and evaluate response 4. Implement non-pharmacological measures as appropriate and evaluate response 5. Consider cultural and social influences on pain and pain management 6. Notify LIP if interventions unsuccessful or patient reports new pain Outcome: Progressing   Problem: INFECTION - ADULT Goal: Absence of infection during hospitalization Description: INTERVENTIONS: 1. Assess and monitor for signs and symptoms of infection 2. Monitor lab/diagnostic results 3. Monitor all insertion sites i.e., indwelling lines, tubes and drains 4. Monitor endotracheal (as able) and nasal secretions for changes in amount and color 5. Institute appropriate cooling/warming therapies per order 6. Administer medications as ordered 7. Instruct and encourage patient and family to use good hand hygiene technique 8. Identify and instruct in appropriate isolation precautions for identified infection/condition Outcome: Progressing Goal:  Absence of fever/infection during anticipated neutropenic period Description: INTERVENTIONS 1. Monitor WBC 2. Administer growth factors as ordered 3. Implement neutropenic guidelines as ordered Outcome: Progressing   Problem: Safety - Adult Goal: Free from fall injury Description: INTERVENTIONS: 1. Assess pt frequently for physical needs 2. Identify cognitive and physical deficits and behaviors that affect risk of falls. 3. Institute fall precautions as indicated by assessment. 4. Educate pt/family on patient safety  including physical limitations 5. Instruct pt to call for assistance with activity based on assessment 6. Modify environment to reduce risk of injury 7. Consider OT/PT consult to assist with strengthening/mobility Outcome: Progressing Goal: Absence of infection during hospitalization Description: INTERVENTIONS: 1. Assess and monitor for signs and symptoms of infection 2. Monitor lab/diagnostic results 3. Monitor all insertion sites i.e., indwelling lines, tubes and drains 4. Monitor endotracheal (as able) and nasal secretions for changes in amount and color 5. Institute appropriate cooling/warming therapies per order 6. Administer medications as ordered 7. Instruct and encourage patient and family to use good hand hygiene technique 8. Identify and instruct in appropriate isolation precautions for identified infection/condition Outcome: Progressing   Problem: DISCHARGE PLANNING Goal: Discharge to home or other facility with appropriate resources Description: INTERVENTIONS: 1. Identify barriers to discharge w/pt and caregiver 2. Arrange for needed discharge resources and transportation as appropriate 3. Identify discharge learning needs (meds, wound care, etc) 4. Arrange for interpreters to assist at discharge as needed 5. Refer to Case Management Department for coordinating discharge planning if the patient needs post-hospital services based on physician order or  complex needs related to functional status, cognitive ability or social support system Outcome: Progressing   Problem: Chronic Conditions and Co-Morbidities Goal: Patient's chronic conditions and co-morbidity symptoms are monitored and maintained or improved Description: INTERVENTIONS: 1. Monitor and assess patient's chronic conditions and comorbid symptoms for stability, deterioration, or improvement 2. Collaborate with multidisciplinary team to address chronic and comorbid conditions and prevent exacerbation or deterioration 3. Update acute care plan with appropriate goals if chronic or comorbid symptoms are exacerbated and prevent overall improvement and discharge Outcome: Progressing   Problem: Physical Regulation Goal: Patient will not have signs/symptoms of active bleeding during hospitalization. Description: INTERVENTIONS: 1. Assess skin, mucus membranes, and insertions sites (e.g., IV sites, ET tube, urinary catheters) for signs of frank bleeding, oozing, bruising, or hematoma 2. Monitor vital signs and labs for bleeding risk   Outcome: Progressing   Problem: Safety Goal: Patient/healthcare provider will take precautions to minimize risk of bleeding. Description: INTERVENTIONS: 1. Increase time of dressing pressure 2. Limit needlesticks and venipunctures 3. Avoid rectal suppositories, rectal temperatures, and enemas 4. Use an electric razor for shaving instead of razor blade 5. Administer blood products as ordered  Outcome: Progressing   Problem: Health Behavior Goal: Patient will verbalize understanding of bleeding precautions and signs/symptoms of bleeding that should be reported to healthcare provider. Description: INTERVENTIONS: 1. Educate patient and family on bleeding precautions 2. Educate patient and family on injury prevention 3. Educate on anticoagulation medications 4. Educate patient and family of signs/symptoms of bleeding to report Outcome: Progressing

## 2024-07-19 NOTE — Care Plan (Signed)
  Problem: Health Behavior: Goal: MCB Ability to state ways to decrease the risk of falls will be met by discharge Description: Ability to state ways to decrease the risk of falls will improve by discharge Outcome: Progressing   Problem: Safety: Goal: Will remain free from falls by discharge Description: Will remain free from falls by discharge Outcome: Progressing   Problem: Knowledge Deficit Goal: Patient/family/caregiver demonstrates understanding of disease process, treatment plan, medications, and discharge instructions Description: Complete learning assessment and assess knowledge base. Outcome: Progressing   Problem: Compromised Skin Integrity Goal: Skin integrity is maintained or improved Description: Assess and monitor skin integrity. Identify patients at risk for skin breakdown on admission and per policy. Collaborate with interdisciplinary team and initiate plans and interventions as needed.    Outcome: Progressing Goal: Fluid and electrolyte balance are achieved/maintained Description: Assess and monitor vital signs (orthostatic vitals if applicable), fluid intake and output, urine color, labs, skin turgor, mucous membranes, jugular venous distention, edema, circumference of edematous extremities and abdominal girth, respiratory status, and mental status.  Monitor for signs and symptoms of hypovolemia (tachycardia, rapid breathing, decreased urine output, postural hypotension, confusion, syncope).  Monitor for signs and symptoms of hypervolemia (strong rapid pulse, shortness of breath, difficulty breathing lying down, crackles heard in lung fields, edema). Collaborate with interdisciplinary team and initiate plan and interventions as ordered. Outcome: Progressing Goal: Nutritional status is improving Description: Monitor and assess patient for malnutrition (ex- brittle hair, bruises, dry skin, pale skin and conjunctiva, muscle wasting, smooth red tongue, and disorientation).  Collaborate with interdisciplinary team and initiate plan and interventions as ordered.  Monitor patient's weight and dietary intake as ordered or per policy. Utilize nutrition screening tool and intervene per policy. Determine patient's food preferences and provide high-protein, high-caloric foods as appropriate.  Outcome: Progressing

## 2024-07-19 NOTE — Anesthesia Postprocedure Evaluation (Signed)
 Patient: Timothy Chambers  Procedure Summary     Date: 07/18/24 Room / Location: [facility] ENDOSCOPY   Anesthesia Start: 1316 Anesthesia Stop: 1404   Procedures:      ENTEROSCOPY     COLONOSCOPY Diagnosis:      Acute on chronic anemia     Melena   Scheduled Providers: Ronal Karna Ferraris, MD; Schuyler Fenton Rhine, CRNA; Rutha Patt Farr, MD Responsible Provider: Rutha Patt Farr, MD   Anesthesia Type: MAC ASA Status: 3       Anesthesia Type: MAC  Vitals BP: 127/75 (07/19/2024  4:35 AM) Temp: 98.8 F (37.1 C) (07/19/2024  4:35 AM) Temp Source: Axillary (07/19/2024  4:35 AM) Heart Rate: 80 (07/19/2024  4:35 AM) Resp: 16 (07/19/2024  4:35 AM) SpO2: 100 % (07/19/2024  4:35 AM)   There were no known notable events for this encounter.  Anesthesia Post Evaluation  Final anesthesia type: MAC Patient location during evaluation: Bedside Patient participation: complete - patient participated Level of consciousness: awake and alert Pain score: 0 Pain management: adequate Post-op nausea and vomiting?: none Post-op vital signs: post-procedure vital signs are stable Patient temperature: Normothermic Cardiovascular status: hemodynamically stable Respiratory status: Stable, room air, spontaneous Hydration status: acceptable Post-op disposition: Inpatient Floor Anesthesia post-op complications?:no complications

## 2024-07-19 NOTE — Progress Notes (Signed)
 ------------------------------------------------------------------------------- Attestation signed by [name], MD at 07/19/2024  6:46 PM Attending Attestation: I saw and evaluated the patient. I have personally examined the patient and independently reviewed the important data in the patient's chart, including but not limited to vitals, lab work, tests, and imaging studies. I have rounded with my medical team and have discussed the plan of care with my medical team and the patient. I have reviewed the resident's note and agree with the resident physician's findings and plan.  Pertinent information related to the patient's case and today's plan is outlined below.   Borderline hemorrhagic shock, resolved Acute on chronic anemia requiring multiple transfusions Acute blood loss anemia Acute UGIB Duodenal ulcer Timothy Chambers is a 68 year old man with medical history significant for CKD stage V (baseline Cr 4.5-5.0) admitted 10/4 due to acute on chronic anemia (Hgb 4.0 on Admit) due to an acute upper GI bleed evidenced by melenic stools, low Hgb, and abdominal discomfort. Complicated by AKI on CKD, likely pre-renal in setting of acute anemia superimposed on component of anemia of chronic disease given advanced CKD. Patient underwent endoscopy with GI on 10/7 which showed single cratered ulcer in the 1st part of the duodenum with nonbleeding visible vessel (Forrest IIA); two clips were placed for hemostasis. Patient without episode of re bleeding following procedure. He remains on CLD.  Patient so far s/p 5 pRBC transfusions, including 1 unit this morning. IV PPI BID ongoing. 2 large bore IVs in place. We will continue to hold home beta blocker (for HTN) at this time. Notably, patient is chronically on prednisone  for eczema, which unfortunately may have contributed to etiology of GI bleed.  -------------------------------------------------------------------------------  [facility] [place] [facility]  Progress Note  ASSESSMENT/PLAN: Timothy Chambers is a 68 y.o. male  PMH of CKD 5 (baseline creatinine 4.5-5 hypertension, depression, chronic anemia, admitted for acute GI bleed with symptomatic anemia. Patient had a scope with GI w/ clipping of vessel in duodenal ulcer. Now monitoring for re-bleeding    Daily updates from rounds:  - Patient had Hb of 6.3 this am. Likely where patient stabilized from his bleeding episode yesterday. We transfused 1 u of pRBCs to get his Hb > 7.  - Will continue clear liquid diet until hemoglobin stabilizes.  - Diet Plan: Will remain on clear liquids until Hgb stabilizes and BRB per rectum stops (possibly this afternoon/evening, Full liquids as next step. Monitor for another 24-48 hrs in hospital ideally. Low residue/low fiber diet would be next step for tomorrow.    # Upper gi bleed # Acute blood loss anemia, active Patient with a history of greater than 1 week of general malaise, and notable melena within the past 48 hours.  Hemoglobin 4.0 on admission.  GI consulted in the ED who stated intervention will be planned for 10/5 - last episode of melena on 10/5 - s/p 3 units RBCs - GI Consult: recommend PPI BID, and continuing clear liquid diet  - Protonix  40 mg IV twice daily -- maintain  2 large bore Ivs - CBC every 8 hours   # AKI on CKD stage V, resolved Per chart review patient sees Fairy Sellar for nephro.  Likely pre-renal in the setting of blood loss.  Cr on admission 5.5, baseline 4.5-5 PLAN: - CTM    Chronic Medical Problems: HTN- amlodipine  10 mg, holding coreg  until discharge  Dyshidrotic eczema- Pred 2.5mg  held iso GI bleed  Hospital Checklist: #DVT PPX: SCDs only 2/2 bleeding #FEN: Adult  Diet- Liquid; Clear liquids #Discharge Planning: Admitted pending clinical stability #Ethics: Full Code  Interval History: 07/19/24 Hospital Day: 4 - NAEO - Patient required 1 unit for Hb of 6.3 this am.  - Patient feeling much improved from  yesterday. Hb is still on lower side   OBJECTIVE: Vital Signs: Temp:  [97.2 F (36.2 C)-98.8 F (37.1 C)] 98.8 F (37.1 C) Heart Rate:  [63-84] 80 Resp:  [12-22] 16 BP: (74-145)/(54-87) 127/75  Physical Exam: 07/19/24 Constitutional: Well-developed, well-nourished, NAD, conversational Cardiovascular: RRR, normal S1/S2, no m/r/g, peripheral pulses 2+ Respiratory: On room air. Lungs CTAB. Normal work of breathing. Abdominal: Soft, nondistended, nontender to palpation, Bowel sounds+ Skin: No obvious rashes or lesions. Warm and dry. No peripheral edema Neuro: AOx4. Follows commands. No gross focal deficits.  Psych: Appropriate mood and affect  Electronically signed by: Timothy Dasie Sar, MD 07/19/2024 6:31 AM

## 2024-07-21 ENCOUNTER — Inpatient Hospital Stay (HOSPITAL_COMMUNITY): Admission: RE | Admit: 2024-07-21 | Source: Ambulatory Visit

## 2024-07-24 NOTE — Discharge Summary (Signed)
 ------------------------------------------------------------------------------- Attestation signed by Mliss Jerrye Mends, MD at 07/28/2024 12:09 PM Attending Attestation: I saw and evaluated the patient on the day of discharge. I have reviewed the resident's note, and I agree with the summary of the patient's hospitalization and discharge plan as outlined by Dr. Sharyne Nida, MD .    [phone] M w CKD admitted for upper GI bleed & symptomatic anemia. GI cauterized bleeding ulcer. Pt kept on PPI. Did require 3 units PRBCs in total during hospitalization. Hgb stable >8 at time of discharge. H pylori treatment continued, will need to finish as outpatient for full 2 week course.  Of note CKD seems to be progressing (or possibly aggravated by his acute blood loss anemia). We initiated bicarb. He thinks he Timothy already be on this at home, but it is not on his med rec. Possible he has OTC supply at home. He has close follow up with his nephrologist.   A total of 25 minutes of my time was spent on reviewing the patients chart including recent vitals and lab/test results, talking with and examining the patient, discussing the discharge plan with the patient and my medical team, preparing and reviewing the med rec, providing discharge instructions, and preparing discharge records.  Electronically Signed by: Mliss Jerrye Mends, MD, Attending Physician 07/28/2024 12:06 PM  -------------------------------------------------------------------------------    [facility] [place] [facility] Discharge Summary  Name: Timothy Chambers MRN: [id number] Age: 68 yrs DOB: 08-24-1956  Admit date: 07/23/2024   Discharge date and time: 07/27/2024  Admitting Physician: [name], MD Discharge Physician: : Mliss Jerrye Eckelbarger*  Admission Diagnoses:  GI bleed [K92.2] ESRD (end stage renal disease) [N18.6] Anemia, unspecified type [D64.9] Gastrointestinal hemorrhage, unspecified  gastrointestinal hemorrhage type [K92.2]   Discharge Diagnoses:  #Upper GI bleed #H Pylori   Admission Condition: fair Discharged Condition: good  Hospital Course:  For full details, please see H&P, progress notes, consult notes and ancillary notes. Briefly, Timothy Chambers is a 68 y.o. male with past medical history of CKD stage V (baseline creatinine 4.5-5), hypertension, depression, chronic anemia admitted found to have acute GI bleed with symptomatic anemia as well as hematemesis and hematochezia.   #Acute blood loss anemia 2/2 upper GI bleed The patient presents from how due to melena and hematemesis.  He had a recent admission for similar presentation from 10/4 - 10/10.  The patient received RBC transfusion x 3 and  fluid resuscitation he was taken for EGD which demonstrated duodenal bulb ulcer with oozing hemorrhage.  Epinephrine injection was performed and residual visible vessel was obliterated with BiCAP.  2 clips were placed.  Hemostasis was achieved.  He was continued on IV PPI twice daily and then transitioned to oral omeprazole 40 mg twice daily.  #H pylori He had H. pylori infection identified on gastric biopsy from prior recent EGD.  His quadruple therapy was continued during his admission and at discharge he was prescribed tetracycline, metronidazole, omeprazole, and Pepto bismol.  Patient was also evaluated by PT/OT during his stay who recommended home health PT/OT and DME which was ordered.   Discharge Follow-up Action Items: Follow up with PCP in 1-2 weeks Ensure completion of treatment of quadruple therapy for H pylori  Repeat CBC to monitor hemoglobin  Hold amlodipine  and carvedilol  until PCP follow up Follow up with nephrology for CKD, appointment scheduled for early next week  Patient's Ordered Code Status: Full Code  Consults: IP CONSULT TO HOSPITALIST IP CONSULT TO GASTROENTEROLOGY  CBD:  Results  from last 7 days  Lab Units 07/27/24 0657 07/27/24 0022  07/26/24 1556 07/24/24 1102 07/24/24 0015 07/24/24 0008  WHITE BLOOD CELL COUNT 10*3/uL 9.27 10.27 9.84   < >  --  11.21*  RED BLOOD CELL COUNT 10*6/uL 2.76* 2.78* 2.70*   < >  --  1.78*  HEMOGLOBIN g/dL 8.2* 8.3* 8.0*   < >  --  5.1*  HEMATOCRIT % 23.8* 23.8* 23.0*   < >  --  15.5*  MEAN CORPUSCULAR VOLUME fL 86.3 85.6 85.2   < >  --  87.0  MEAN CORPUSCULAR HEMOGLOBIN pg 29.7 29.7 29.5   < >  --  28.8  MEAN CORPUSCULAR HEMOGLOBIN CONC g/dL 65.5 65.2 65.3   < >  --  33.1  RED CELL DISTRIBUTION WIDTH % 16.4 16.5 16.1   < >  --  16.5  MEAN PLATELET VOLUME fL 8.0 7.4 7.6   < >  --  8.4  PLATELET COUNT 10*3/uL 287 277 262   < >  --  336  LYMPHOCYTES RELATIVE PERCENT %  --   --   --   --   --  11  NEUTROPHILS RELATIVE PERCENT %  --   --   --   --   --  79  MONOCYTES RELATIVE PERCENT %  --   --   --   --   --  10  EOSINOPHILS RELATIVE PERCENT %  --   --   --   --   --  0  BASOPHILS RELATIVE PERCENT %  --   --   --   --   --  1  NEUTROPHILS ABSOLUTE COUNT 10*3/uL  --   --   --   --   --  8.80*  LYMPHOCYTES ABSOLUTE COUNT 10*3/uL  --   --   --   --   --  1.20  MONOCYTES ABSOLUTE COUNT 10*3/uL  --   --   --   --   --  1.10*  EOSINOPHILS ABSOLUTE COUNT 10*3/uL  --   --   --   --   --  0.00  BASOPHILS ABSOLUTE COUNT 10*3/uL  --   --   --   --   --  0.10  CROSSMATCH   --   --   --   --  Electronically Compatible  Electronically Compatible  Electronically Compatible  Electronically Compatible  Electronically Compatible  Electronically Compatible  Electronically Compatible  --    < > = values in this interval not displayed.   CMP:  Results from last 7 days  Lab Units 07/27/24 0022 07/26/24 0018 07/25/24 0049 07/24/24 0427 07/24/24 0008 07/21/24 0403  SODIUM mmol/L 139 137 137 139 139 139  POTASSIUM mmol/L 3.7 3.8 4.1 4.2 4.4 3.6  CHLORIDE mmol/L 117* 115* 117* 119* 118* 116*  CO2 mmol/L 14* 16* 12* 14* 13* 17*  BUN mg/dL 50* 57* 58* 59* 58* 53*  CREATININE mg/dL 4.20* 4.08* 4.30*  4.30* 5.80* 4.82*  CALCIUM mg/dL 7.1* 6.8* 7.0* 7.1* 7.4* 7.6*  MAGNESIUM  mg/dL 1.7* 2.0 2.1 1.6*  --  1.7*  PHOSPHORUS mg/dL  --   --   --   --   --  3.6  BILIRUBIN TOTAL mg/dL  --   --   --   --  0.2*  --   AST U/L  --   --   --   --  6*  --   ALT U/L  --   --   --   --  3*  --   TOTAL PROTEIN g/dL  --   --   --   --  4.4*  --   ALBUMIN g/dL  --   --   --   --  2.5*  --   ANION GAP mmol/L 8 6 8 6 8 6     Disposition: Home Health Care   Patient Instructions:    Discharge Medications     New Medications      Sig Disp Refill Start End  calcium carbonate 1250 mg (500 mg calcium) tablet Commonly known as: OS-CAL  Take 1 tablet (1,250 mg total) by mouth daily.  100 tablet  0     sodium bicarbonate  650 mg tablet  Take 1 tablet (650 mg total) by mouth 3 (three) times a day.  270 tablet  0         Modified Medications      Sig Disp Refill Start End  omeprazole 40 mg DR capsule Commonly known as: PriLOSEC What changed: when to take this  Take 1 capsule (40 mg total) by mouth 2 (two) times a day.  60 capsule  0     tetracycline 500 mg capsule Commonly known as: SUMYCIN What changed:  medication strength when to take this  Take 1 capsule (500 mg total) by mouth daily for 10 days.  10 capsule  0         Medications To Continue      Sig Disp Refill Start End  bismuth subsalicylate 262 mg chewable tablet Commonly known as: PEPTO BISMOL  Chew 2 tablets (524 mg total) 4 (four) times a day for 10 days.  90 tablet  0     carvediloL  6.25 mg tablet Commonly known as: COREG   Take 1 tablet (6.25 mg total) by mouth in the morning and 1 tablet (6.25 mg total) in the evening. Take with meals.  180 tablet  0     clobetasoL 0.05 % cream Commonly known as: TEMOVATE  Apply twice a day to only the scaly areas.  60 g  3     metroNIDAZOLE 500 mg tablet Commonly known as: FLAGYL  Take 1 tablet (500 mg total) by mouth 3 (three) times a day for 10 days.  31 tablet  0      varicella-zoster (PF) 50 mcg/0.5 mL Susr vaccine Commonly known as: SHINGRIX  Administer one dose. Then administer second dose in 2-6 months  0.5 mL  1         Unreviewed Medications      Sig Disp Refill Start End  amLODIPine  10 mg tablet Commonly known as: NORVASC   Take 1 tablet (10 mg total) by mouth daily.  90 tablet  0     calcium acetate 667 mg (169 mg calcium) capsule Commonly known as: PHOSLO  Take 1 capsule (667 mg total) by mouth in the morning and 1 capsule (667 mg total) at noon and 1 capsule (667 mg total) in the evening. Take with meals.  90 capsule  1     silver sulfADIAZINE 1 % cream Commonly known as: SILVADENE  Apply topically daily. Apply topically to open lesions on the hands and feet  400 g  3          Discharge Orders     Bedside Commode     Details:    Commode Type: 3-in-1   Height (in inches): 1.651 m (5' 5)   Weight (in lbs.): 47.5 kg (104 lb 11.5 oz)  Length of need: Lifetime   Occupational Therapy Home Health Coordination     Details:    Actions: Resume Home Health   Physical Therapy Home Health Coordination     Details:    Actions: Resume Home Health   Walker     Details:    Height (in inches): 1.651 m (5' 5)   Weight (in lbs.): 47.5 kg (104 lb 11.5 oz)   Walker Options: Rolling   Length of need: Lifetime       Follow-up  Future Appointments  Date Time Provider Department Center  08/18/2024 11:40 AM Ole Waddell Gaskins, PA-C [facility]     Electronically signed by: Sharyne Nida, MD 07/27/2024

## 2024-08-15 ENCOUNTER — Ambulatory Visit (HOSPITAL_COMMUNITY)
Admission: RE | Admit: 2024-08-15 | Discharge: 2024-08-15 | Disposition: A | Discharge status: Home / Self Care | Source: Ambulatory Visit | Attending: Nephrology | Admitting: Nephrology

## 2024-08-15 VITALS — BP 144/73 | HR 78 | Temp 98.7°F | Resp 18

## 2024-08-15 DIAGNOSIS — D631 Anemia in chronic kidney disease: Secondary | ICD-10-CM | POA: Insufficient documentation

## 2024-08-15 DIAGNOSIS — N184 Chronic kidney disease, stage 4 (severe): Secondary | ICD-10-CM | POA: Insufficient documentation

## 2024-08-15 LAB — IRON AND TIBC
Iron: 40 ug/dL — ABNORMAL LOW (ref 45–182)
Saturation Ratios: 27 % (ref 17.9–39.5)
TIBC: 147 ug/dL — ABNORMAL LOW (ref 250–450)
UIBC: 107 ug/dL

## 2024-08-15 LAB — RENAL FUNCTION PANEL
Albumin: 2.4 g/dL — ABNORMAL LOW (ref 3.5–5.0)
Anion gap: 10 (ref 5–15)
BUN: 34 mg/dL — ABNORMAL HIGH (ref 8–23)
CO2: 25 mmol/L (ref 22–32)
Calcium: 8.1 mg/dL — ABNORMAL LOW (ref 8.9–10.3)
Chloride: 105 mmol/L (ref 98–111)
Creatinine, Ser: 4.49 mg/dL — ABNORMAL HIGH (ref 0.61–1.24)
GFR, Estimated: 14 mL/min — ABNORMAL LOW (ref >60)
Glucose, Bld: 94 mg/dL (ref 70–99)
Phosphorus: 3.6 mg/dL (ref 2.5–4.6)
Potassium: 3 mmol/L — ABNORMAL LOW (ref 3.5–5.1)
Sodium: 140 mmol/L (ref 135–145)

## 2024-08-15 LAB — FERRITIN: Ferritin: 165 ng/mL (ref 24–336)

## 2024-08-15 LAB — POCT HEMOGLOBIN-HEMACUE: Hemoglobin: 9 g/dL — ABNORMAL LOW (ref 13.0–17.0)

## 2024-08-15 MED ORDER — EPOETIN ALFA-EPBX 10000 UNIT/ML IJ SOLN
10000.0000 [IU] | Freq: Once | INTRAMUSCULAR | Status: AC
  Administered 2024-08-15: 10000 [IU] via SUBCUTANEOUS

## 2024-08-15 MED ORDER — EPOETIN ALFA-EPBX 10000 UNIT/ML IJ SOLN
INTRAMUSCULAR | Status: AC
  Filled 2024-08-15: qty 1

## 2024-08-15 MED ORDER — EPOETIN ALFA-EPBX 20000 UNIT/ML IJ SOLN
20000.0000 [IU] | Freq: Once | INTRAMUSCULAR | Status: AC
  Administered 2024-08-15: 20000 [IU] via SUBCUTANEOUS

## 2024-08-15 MED ORDER — EPOETIN ALFA-EPBX 20000 UNIT/ML IJ SOLN
INTRAMUSCULAR | Status: AC
  Filled 2024-08-15: qty 1

## 2024-08-18 ENCOUNTER — Other Ambulatory Visit (HOSPITAL_COMMUNITY): Payer: Self-pay | Admitting: Nephrology

## 2024-08-18 ENCOUNTER — Inpatient Hospital Stay (HOSPITAL_COMMUNITY): Admission: RE | Admit: 2024-08-18 | Source: Ambulatory Visit

## 2024-08-18 DIAGNOSIS — D631 Anemia in chronic kidney disease: Secondary | ICD-10-CM | POA: Insufficient documentation

## 2024-09-05 ENCOUNTER — Encounter (HOSPITAL_COMMUNITY): Payer: Self-pay | Admitting: Nephrology

## 2024-09-12 ENCOUNTER — Ambulatory Visit (HOSPITAL_COMMUNITY)
Admission: RE | Admit: 2024-09-12 | Discharge: 2024-09-12 | Disposition: A | Source: Ambulatory Visit | Attending: Nephrology

## 2024-09-12 VITALS — BP 130/74 | HR 66 | Temp 97.2°F | Resp 16

## 2024-09-12 DIAGNOSIS — N185 Chronic kidney disease, stage 5: Secondary | ICD-10-CM | POA: Diagnosis present

## 2024-09-12 DIAGNOSIS — D631 Anemia in chronic kidney disease: Secondary | ICD-10-CM | POA: Insufficient documentation

## 2024-09-12 LAB — RENAL FUNCTION PANEL
Albumin: 2.9 g/dL — ABNORMAL LOW (ref 3.5–5.0)
Anion gap: 11 (ref 5–15)
BUN: 46 mg/dL — ABNORMAL HIGH (ref 8–23)
CO2: 21 mmol/L — ABNORMAL LOW (ref 22–32)
Calcium: 9 mg/dL (ref 8.9–10.3)
Chloride: 105 mmol/L (ref 98–111)
Creatinine, Ser: 4.68 mg/dL — ABNORMAL HIGH (ref 0.61–1.24)
GFR, Estimated: 13 mL/min — ABNORMAL LOW (ref >60)
Glucose, Bld: 165 mg/dL — ABNORMAL HIGH (ref 70–99)
Phosphorus: 4.6 mg/dL (ref 2.5–4.6)
Potassium: 3.6 mmol/L (ref 3.5–5.1)
Sodium: 137 mmol/L (ref 135–145)

## 2024-09-12 LAB — POCT HEMOGLOBIN-HEMACUE: Hemoglobin: 10 g/dL — ABNORMAL LOW (ref 13.0–17.0)

## 2024-09-12 MED ORDER — EPOETIN ALFA-EPBX 40000 UNIT/ML IJ SOLN
INTRAMUSCULAR | Status: AC
  Filled 2024-09-12: qty 1

## 2024-09-12 MED ORDER — EPOETIN ALFA-EPBX 40000 UNIT/ML IJ SOLN
40000.0000 [IU] | Freq: Once | INTRAMUSCULAR | Status: AC
  Administered 2024-09-12: 40000 [IU] via SUBCUTANEOUS

## 2024-10-10 ENCOUNTER — Encounter (HOSPITAL_COMMUNITY)
Admission: RE | Admit: 2024-10-10 | Discharge: 2024-10-10 | Disposition: A | Discharge status: Home / Self Care | Source: Ambulatory Visit | Attending: Nephrology | Admitting: Nephrology

## 2024-10-10 VITALS — BP 132/74 | HR 68 | Temp 97.0°F | Resp 15

## 2024-10-10 DIAGNOSIS — N185 Chronic kidney disease, stage 5: Secondary | ICD-10-CM | POA: Insufficient documentation

## 2024-10-10 DIAGNOSIS — D631 Anemia in chronic kidney disease: Secondary | ICD-10-CM | POA: Insufficient documentation

## 2024-10-10 LAB — POCT HEMOGLOBIN-HEMACUE: Hemoglobin: 11.1 g/dL — ABNORMAL LOW (ref 13.0–17.0)

## 2024-10-10 MED ORDER — EPOETIN ALFA-EPBX 40000 UNIT/ML IJ SOLN
INTRAMUSCULAR | Status: AC
  Filled 2024-10-10: qty 1

## 2024-10-10 MED ORDER — EPOETIN ALFA-EPBX 40000 UNIT/ML IJ SOLN
40000.0000 [IU] | Freq: Once | INTRAMUSCULAR | Status: AC
  Administered 2024-10-10: 40000 [IU] via SUBCUTANEOUS

## 2024-10-19 ENCOUNTER — Other Ambulatory Visit (HOSPITAL_COMMUNITY): Payer: Self-pay | Admitting: Nephrology

## 2024-10-19 NOTE — Progress Notes (Signed)
 Received updated Retacrit  orders from [facility].  Dx: D63.1/N18.5 Retacrit  20000 units every 4 weeks  HgB before each injection Renal panel q 4 weeks Ferritin and iron  panel q 2 months  Sherry Pennant, PharmD, MPH, BCPS, CPP Clinical Pharmacist

## 2024-10-31 ENCOUNTER — Encounter (HOSPITAL_COMMUNITY): Payer: Self-pay | Admitting: Nephrology

## 2024-11-02 ENCOUNTER — Telehealth (HOSPITAL_COMMUNITY): Payer: Self-pay | Admitting: Pharmacy Technician

## 2024-11-02 NOTE — Telephone Encounter (Signed)
 Auth Submission: APPROVED Site of care: [facility] Payer: [employer] MEDICARE Medication & CPT/J Code(s) submitted: V4893 - RETACRIT  NON-ESRD  Diagnosis Code: N18.5, D63.1 Route of submission (phone, fax, portal): CMM Phone # Fax # Auth type: Buy/Bill HB Units/visits requested: 20000 units every 4 weeks Reference number: [id number] Auth: [id number] Approval from: 11/07/24 to 10/11/25    Dagoberto Armour, CPhT [facility] Phone: [phone] 11/02/2024

## 2024-11-07 ENCOUNTER — Inpatient Hospital Stay (HOSPITAL_COMMUNITY): Admission: RE | Admit: 2024-11-07 | Source: Ambulatory Visit

## 2024-11-07 ENCOUNTER — Encounter (HOSPITAL_COMMUNITY): Payer: Self-pay

## 2024-11-09 ENCOUNTER — Ambulatory Visit (HOSPITAL_COMMUNITY)
Admission: RE | Admit: 2024-11-09 | Discharge: 2024-11-09 | Disposition: A | Discharge status: Home / Self Care | Source: Ambulatory Visit | Attending: Nephrology | Admitting: Nephrology

## 2024-11-09 VITALS — BP 129/73 | HR 74 | Temp 97.9°F | Resp 15

## 2024-11-09 DIAGNOSIS — D631 Anemia in chronic kidney disease: Secondary | ICD-10-CM | POA: Insufficient documentation

## 2024-11-09 DIAGNOSIS — N185 Chronic kidney disease, stage 5: Secondary | ICD-10-CM | POA: Insufficient documentation

## 2024-11-09 LAB — RENAL FUNCTION PANEL
Albumin: 4.2 g/dL (ref 3.5–5.0)
Anion gap: 16 — ABNORMAL HIGH (ref 5–15)
BUN: 65 mg/dL — ABNORMAL HIGH (ref 8–23)
CO2: 15 mmol/L — ABNORMAL LOW (ref 22–32)
Calcium: 8.7 mg/dL — ABNORMAL LOW (ref 8.9–10.3)
Chloride: 107 mmol/L (ref 98–111)
Creatinine, Ser: 5.01 mg/dL — ABNORMAL HIGH (ref 0.61–1.24)
GFR, Estimated: 12 mL/min — ABNORMAL LOW
Glucose, Bld: 86 mg/dL (ref 70–99)
Phosphorus: 4.5 mg/dL (ref 2.5–4.6)
Potassium: 3.5 mmol/L (ref 3.5–5.1)
Sodium: 138 mmol/L (ref 135–145)

## 2024-11-09 LAB — IRON AND TIBC
Iron: 129 ug/dL (ref 45–182)
Saturation Ratios: 48 % — ABNORMAL HIGH (ref 17.9–39.5)
TIBC: 267 ug/dL (ref 250–450)
UIBC: 138 ug/dL

## 2024-11-09 LAB — POCT HEMOGLOBIN-HEMACUE: Hemoglobin: 11.1 g/dL — ABNORMAL LOW (ref 13.0–17.0)

## 2024-11-09 LAB — FERRITIN: Ferritin: 405 ng/mL — ABNORMAL HIGH (ref 24–336)

## 2024-11-09 MED ORDER — EPOETIN ALFA-EPBX 20000 UNIT/ML IJ SOLN
20000.0000 [IU] | Freq: Once | INTRAMUSCULAR | Status: AC
  Administered 2024-11-09: 20000 [IU] via SUBCUTANEOUS

## 2024-11-09 MED ORDER — CLONIDINE HCL 0.1 MG PO TABS: 0.1000 mg | ORAL_TABLET | ORAL | Status: DC | PRN

## 2024-11-09 MED ORDER — EPOETIN ALFA-EPBX 20000 UNIT/ML IJ SOLN
INTRAMUSCULAR | Status: AC
  Filled 2024-11-09: qty 1

## 2024-12-05 ENCOUNTER — Encounter (HOSPITAL_COMMUNITY)

## 2024-12-07 ENCOUNTER — Encounter (HOSPITAL_COMMUNITY)
